# Patient Record
Sex: Male | Born: 1945 | Race: White | Hispanic: No | Smoking: Current every day smoker
Health system: Southern US, Community
[De-identification: ages and names within clinical notes are randomized; demographics above are authoritative.]

## PROBLEM LIST (undated history)

## (undated) DIAGNOSIS — N400 Enlarged prostate without lower urinary tract symptoms: Secondary | ICD-10-CM

## (undated) DIAGNOSIS — N189 Chronic kidney disease, unspecified: Secondary | ICD-10-CM

## (undated) DIAGNOSIS — R413 Other amnesia: Secondary | ICD-10-CM

## (undated) DIAGNOSIS — K5790 Diverticulosis of intestine, part unspecified, without perforation or abscess without bleeding: Secondary | ICD-10-CM

## (undated) DIAGNOSIS — K635 Polyp of colon: Secondary | ICD-10-CM

## (undated) DIAGNOSIS — I639 Cerebral infarction, unspecified: Secondary | ICD-10-CM

## (undated) DIAGNOSIS — I10 Essential (primary) hypertension: Secondary | ICD-10-CM

## (undated) DIAGNOSIS — N281 Cyst of kidney, acquired: Secondary | ICD-10-CM

## (undated) DIAGNOSIS — H9190 Unspecified hearing loss, unspecified ear: Secondary | ICD-10-CM

## (undated) DIAGNOSIS — D497 Neoplasm of unspecified behavior of endocrine glands and other parts of nervous system: Secondary | ICD-10-CM

## (undated) DIAGNOSIS — R42 Dizziness and giddiness: Secondary | ICD-10-CM

## (undated) DIAGNOSIS — R600 Localized edema: Secondary | ICD-10-CM

## (undated) DIAGNOSIS — D649 Anemia, unspecified: Secondary | ICD-10-CM

## (undated) DIAGNOSIS — F32A Depression, unspecified: Secondary | ICD-10-CM

## (undated) DIAGNOSIS — R609 Edema, unspecified: Secondary | ICD-10-CM

## (undated) DIAGNOSIS — G930 Cerebral cysts: Secondary | ICD-10-CM

## (undated) DIAGNOSIS — F329 Major depressive disorder, single episode, unspecified: Secondary | ICD-10-CM

## (undated) DIAGNOSIS — E7211 Homocystinuria: Secondary | ICD-10-CM

## (undated) DIAGNOSIS — R3129 Other microscopic hematuria: Secondary | ICD-10-CM

## (undated) DIAGNOSIS — N401 Enlarged prostate with lower urinary tract symptoms: Secondary | ICD-10-CM

## (undated) HISTORY — DX: Major depressive disorder, single episode, unspecified: F32.9

## (undated) HISTORY — DX: Localized edema: R60.0

## (undated) HISTORY — DX: Other microscopic hematuria: R31.29

## (undated) HISTORY — DX: Depression, unspecified: F32.A

## (undated) HISTORY — DX: Polyp of colon: K63.5

## (undated) HISTORY — DX: Other amnesia: R41.3

## (undated) HISTORY — DX: Unspecified hearing loss, unspecified ear: H91.90

## (undated) HISTORY — PX: LAPAROTOMY: SHX154

## (undated) HISTORY — DX: Dizziness and giddiness: R42

## (undated) HISTORY — DX: Benign prostatic hyperplasia without lower urinary tract symptoms: N40.0

## (undated) HISTORY — DX: Homocystinuria: E72.11

## (undated) HISTORY — DX: Anemia, unspecified: D64.9

## (undated) HISTORY — PX: BRAIN SURGERY: SHX531

## (undated) HISTORY — DX: Cerebral cysts: G93.0

## (undated) HISTORY — DX: Cerebral infarction, unspecified: I63.9

## (undated) HISTORY — DX: Neoplasm of unspecified behavior of endocrine glands and other parts of nervous system: D49.7

## (undated) HISTORY — DX: Benign prostatic hyperplasia with lower urinary tract symptoms: N40.1

## (undated) HISTORY — DX: Diverticulosis of intestine, part unspecified, without perforation or abscess without bleeding: K57.90

## (undated) HISTORY — DX: Essential (primary) hypertension: I10

## (undated) HISTORY — DX: Chronic kidney disease, unspecified: N18.9

## (undated) HISTORY — DX: Edema, unspecified: R60.9

## (undated) HISTORY — DX: Cyst of kidney, acquired: N28.1

---

## 1999-01-15 ENCOUNTER — Encounter: Admission: RE | Admit: 1999-01-15 | Discharge: 1999-01-15 | Payer: Self-pay | Admitting: Neurosurgery

## 1999-01-15 ENCOUNTER — Encounter: Payer: Self-pay | Admitting: Neurosurgery

## 2000-07-27 ENCOUNTER — Encounter: Payer: Self-pay | Admitting: Neurosurgery

## 2000-07-27 ENCOUNTER — Encounter: Admission: RE | Admit: 2000-07-27 | Discharge: 2000-07-27 | Payer: Self-pay | Admitting: Neurosurgery

## 2001-08-28 ENCOUNTER — Emergency Department (HOSPITAL_COMMUNITY): Admission: EM | Admit: 2001-08-28 | Discharge: 2001-08-28 | Payer: Self-pay | Admitting: *Deleted

## 2004-04-23 ENCOUNTER — Ambulatory Visit: Payer: Self-pay | Admitting: Internal Medicine

## 2004-11-17 ENCOUNTER — Ambulatory Visit: Payer: Self-pay | Admitting: Internal Medicine

## 2004-11-18 ENCOUNTER — Ambulatory Visit: Payer: Self-pay | Admitting: Internal Medicine

## 2004-11-21 ENCOUNTER — Ambulatory Visit: Payer: Self-pay | Admitting: Internal Medicine

## 2005-02-20 ENCOUNTER — Ambulatory Visit: Payer: Self-pay | Admitting: Internal Medicine

## 2005-04-27 ENCOUNTER — Ambulatory Visit: Payer: Self-pay | Admitting: Internal Medicine

## 2005-04-28 ENCOUNTER — Ambulatory Visit: Payer: Self-pay | Admitting: Internal Medicine

## 2005-05-06 ENCOUNTER — Ambulatory Visit: Payer: Self-pay | Admitting: Internal Medicine

## 2005-06-03 ENCOUNTER — Ambulatory Visit: Payer: Self-pay | Admitting: Internal Medicine

## 2005-06-04 ENCOUNTER — Ambulatory Visit: Payer: Self-pay | Admitting: Cardiology

## 2005-06-11 ENCOUNTER — Ambulatory Visit: Payer: Self-pay | Admitting: Internal Medicine

## 2005-07-03 ENCOUNTER — Ambulatory Visit: Payer: Self-pay

## 2005-11-13 ENCOUNTER — Ambulatory Visit: Payer: Self-pay | Admitting: Internal Medicine

## 2005-11-23 ENCOUNTER — Ambulatory Visit: Payer: Self-pay | Admitting: Cardiology

## 2005-11-23 ENCOUNTER — Ambulatory Visit: Payer: Self-pay | Admitting: Internal Medicine

## 2005-12-03 ENCOUNTER — Ambulatory Visit: Payer: Self-pay

## 2005-12-03 ENCOUNTER — Encounter: Admission: RE | Admit: 2005-12-03 | Discharge: 2005-12-03 | Payer: Self-pay | Admitting: Internal Medicine

## 2006-03-29 ENCOUNTER — Ambulatory Visit: Payer: Self-pay | Admitting: Internal Medicine

## 2006-03-30 DIAGNOSIS — H9190 Unspecified hearing loss, unspecified ear: Secondary | ICD-10-CM | POA: Insufficient documentation

## 2006-03-30 DIAGNOSIS — D369 Benign neoplasm, unspecified site: Secondary | ICD-10-CM | POA: Insufficient documentation

## 2006-04-16 ENCOUNTER — Ambulatory Visit: Payer: Self-pay | Admitting: Internal Medicine

## 2006-04-16 LAB — CONVERTED CEMR LAB
Chloride: 101 meq/L (ref 96–112)
Creatinine, Ser: 1.7 mg/dL — ABNORMAL HIGH (ref 0.4–1.5)
Glucose, Bld: 96 mg/dL (ref 70–99)
Potassium: 3.8 meq/L (ref 3.5–5.1)
Sodium: 142 meq/L (ref 135–145)

## 2006-04-30 ENCOUNTER — Encounter: Admission: RE | Admit: 2006-04-30 | Discharge: 2006-04-30 | Payer: Self-pay | Admitting: Internal Medicine

## 2006-10-07 ENCOUNTER — Telehealth (INDEPENDENT_AMBULATORY_CARE_PROVIDER_SITE_OTHER): Payer: Self-pay | Admitting: *Deleted

## 2006-10-27 ENCOUNTER — Inpatient Hospital Stay (HOSPITAL_COMMUNITY): Admission: EM | Admit: 2006-10-27 | Discharge: 2006-10-29 | Payer: Self-pay | Admitting: Emergency Medicine

## 2006-10-28 ENCOUNTER — Telehealth: Payer: Self-pay | Admitting: Internal Medicine

## 2006-10-28 ENCOUNTER — Encounter (INDEPENDENT_AMBULATORY_CARE_PROVIDER_SITE_OTHER): Payer: Self-pay | Admitting: Emergency Medicine

## 2006-10-29 ENCOUNTER — Telehealth (INDEPENDENT_AMBULATORY_CARE_PROVIDER_SITE_OTHER): Payer: Self-pay | Admitting: *Deleted

## 2006-10-29 ENCOUNTER — Encounter: Payer: Self-pay | Admitting: Internal Medicine

## 2006-11-01 ENCOUNTER — Emergency Department (HOSPITAL_COMMUNITY): Admission: EM | Admit: 2006-11-01 | Discharge: 2006-11-01 | Payer: Self-pay | Admitting: *Deleted

## 2006-11-02 ENCOUNTER — Ambulatory Visit: Payer: Self-pay | Admitting: Internal Medicine

## 2006-11-02 DIAGNOSIS — R42 Dizziness and giddiness: Secondary | ICD-10-CM

## 2006-11-02 DIAGNOSIS — I6350 Cerebral infarction due to unspecified occlusion or stenosis of unspecified cerebral artery: Secondary | ICD-10-CM | POA: Insufficient documentation

## 2006-11-02 DIAGNOSIS — I10 Essential (primary) hypertension: Secondary | ICD-10-CM | POA: Insufficient documentation

## 2006-11-02 DIAGNOSIS — F17200 Nicotine dependence, unspecified, uncomplicated: Secondary | ICD-10-CM | POA: Insufficient documentation

## 2006-11-05 LAB — CONVERTED CEMR LAB
CO2: 26 meq/L (ref 19–32)
Creatinine, Ser: 2.2 mg/dL — ABNORMAL HIGH (ref 0.4–1.5)
GFR calc Af Amer: 39 mL/min
Potassium: 4.2 meq/L (ref 3.5–5.1)
Sodium: 139 meq/L (ref 135–145)

## 2006-11-15 ENCOUNTER — Encounter: Payer: Self-pay | Admitting: Internal Medicine

## 2006-11-16 ENCOUNTER — Ambulatory Visit: Payer: Self-pay | Admitting: Internal Medicine

## 2006-11-16 DIAGNOSIS — D508 Other iron deficiency anemias: Secondary | ICD-10-CM

## 2006-11-26 ENCOUNTER — Ambulatory Visit: Payer: Self-pay | Admitting: Internal Medicine

## 2006-12-03 ENCOUNTER — Ambulatory Visit: Payer: Self-pay | Admitting: Internal Medicine

## 2006-12-13 ENCOUNTER — Ambulatory Visit: Payer: Self-pay | Admitting: Internal Medicine

## 2006-12-13 ENCOUNTER — Telehealth (INDEPENDENT_AMBULATORY_CARE_PROVIDER_SITE_OTHER): Payer: Self-pay | Admitting: *Deleted

## 2006-12-14 ENCOUNTER — Ambulatory Visit: Payer: Self-pay | Admitting: Internal Medicine

## 2006-12-16 ENCOUNTER — Encounter (INDEPENDENT_AMBULATORY_CARE_PROVIDER_SITE_OTHER): Payer: Self-pay | Admitting: *Deleted

## 2006-12-16 LAB — CONVERTED CEMR LAB
CO2: 29 meq/L (ref 19–32)
GFR calc Af Amer: 42 mL/min
Glucose, Bld: 77 mg/dL (ref 70–99)
Potassium: 4 meq/L (ref 3.5–5.1)
Sodium: 139 meq/L (ref 135–145)

## 2007-01-07 ENCOUNTER — Encounter: Payer: Self-pay | Admitting: Internal Medicine

## 2007-01-14 ENCOUNTER — Ambulatory Visit: Payer: Self-pay | Admitting: Internal Medicine

## 2007-01-31 ENCOUNTER — Encounter: Payer: Self-pay | Admitting: Internal Medicine

## 2007-02-11 ENCOUNTER — Encounter: Payer: Self-pay | Admitting: Internal Medicine

## 2007-02-16 ENCOUNTER — Encounter: Payer: Self-pay | Admitting: Internal Medicine

## 2007-02-21 ENCOUNTER — Telehealth (INDEPENDENT_AMBULATORY_CARE_PROVIDER_SITE_OTHER): Payer: Self-pay | Admitting: *Deleted

## 2007-02-25 ENCOUNTER — Telehealth (INDEPENDENT_AMBULATORY_CARE_PROVIDER_SITE_OTHER): Payer: Self-pay | Admitting: *Deleted

## 2007-02-28 ENCOUNTER — Encounter: Payer: Self-pay | Admitting: Internal Medicine

## 2007-03-07 ENCOUNTER — Telehealth (INDEPENDENT_AMBULATORY_CARE_PROVIDER_SITE_OTHER): Payer: Self-pay | Admitting: *Deleted

## 2007-03-21 ENCOUNTER — Ambulatory Visit: Payer: Self-pay | Admitting: Internal Medicine

## 2007-03-21 DIAGNOSIS — N259 Disorder resulting from impaired renal tubular function, unspecified: Secondary | ICD-10-CM | POA: Insufficient documentation

## 2007-03-22 ENCOUNTER — Telehealth (INDEPENDENT_AMBULATORY_CARE_PROVIDER_SITE_OTHER): Payer: Self-pay | Admitting: *Deleted

## 2007-03-24 ENCOUNTER — Telehealth (INDEPENDENT_AMBULATORY_CARE_PROVIDER_SITE_OTHER): Payer: Self-pay | Admitting: *Deleted

## 2007-03-24 LAB — CONVERTED CEMR LAB
Basophils Absolute: 0 10*3/uL (ref 0.0–0.1)
Calcium: 9.1 mg/dL (ref 8.4–10.5)
Folate: >20 ng/mL
GFR calc Af Amer: 24 mL/min
GFR calc non Af Amer: 20 mL/min
Glucose, Bld: 76 mg/dL (ref 70–99)
MCHC: 34.4 g/dL (ref 30.0–36.0)
Monocytes Absolute: 0.8 10*3/uL — ABNORMAL HIGH (ref 0.2–0.7)
Monocytes Relative: 7.6 % (ref 3.0–11.0)
Platelets: 278 10*3/uL (ref 150–400)
RBC: 4.84 M/uL (ref 4.22–5.81)
RDW: 13.7 % (ref 11.5–14.6)
Vitamin B-12: >1500 pg/mL — ABNORMAL HIGH (ref 211–911)

## 2007-04-06 ENCOUNTER — Telehealth: Payer: Self-pay | Admitting: Internal Medicine

## 2007-04-08 ENCOUNTER — Ambulatory Visit: Payer: Self-pay | Admitting: Internal Medicine

## 2007-04-13 ENCOUNTER — Telehealth: Payer: Self-pay | Admitting: Internal Medicine

## 2007-04-13 LAB — CONVERTED CEMR LAB
Calcium: 8.8 mg/dL (ref 8.4–10.5)
GFR calc Af Amer: 44 mL/min
GFR calc non Af Amer: 36 mL/min
Glucose, Bld: 127 mg/dL — ABNORMAL HIGH (ref 70–99)
Sodium: 141 meq/L (ref 135–145)

## 2007-04-22 ENCOUNTER — Ambulatory Visit: Payer: Self-pay | Admitting: Internal Medicine

## 2007-05-09 ENCOUNTER — Encounter: Payer: Self-pay | Admitting: Internal Medicine

## 2007-05-20 ENCOUNTER — Encounter: Payer: Self-pay | Admitting: Internal Medicine

## 2007-05-24 ENCOUNTER — Ambulatory Visit: Payer: Self-pay | Admitting: Internal Medicine

## 2007-06-24 ENCOUNTER — Ambulatory Visit: Payer: Self-pay | Admitting: Internal Medicine

## 2007-07-25 ENCOUNTER — Ambulatory Visit: Payer: Self-pay | Admitting: Internal Medicine

## 2007-08-09 ENCOUNTER — Encounter: Payer: Self-pay | Admitting: Internal Medicine

## 2007-08-26 ENCOUNTER — Ambulatory Visit: Payer: Self-pay | Admitting: Internal Medicine

## 2007-09-01 ENCOUNTER — Emergency Department (HOSPITAL_COMMUNITY): Admission: EM | Admit: 2007-09-01 | Discharge: 2007-09-01 | Payer: Self-pay | Admitting: Family Medicine

## 2007-09-01 ENCOUNTER — Telehealth: Payer: Self-pay | Admitting: Internal Medicine

## 2007-09-05 ENCOUNTER — Telehealth (INDEPENDENT_AMBULATORY_CARE_PROVIDER_SITE_OTHER): Payer: Self-pay | Admitting: *Deleted

## 2007-09-23 ENCOUNTER — Ambulatory Visit: Payer: Self-pay | Admitting: Internal Medicine

## 2007-09-30 ENCOUNTER — Ambulatory Visit: Payer: Self-pay | Admitting: Internal Medicine

## 2007-09-30 DIAGNOSIS — R413 Other amnesia: Secondary | ICD-10-CM

## 2007-09-30 DIAGNOSIS — E7210 Disorders of sulfur-bearing amino-acid metabolism, unspecified: Secondary | ICD-10-CM | POA: Insufficient documentation

## 2007-10-21 ENCOUNTER — Ambulatory Visit: Payer: Self-pay | Admitting: Internal Medicine

## 2007-11-18 ENCOUNTER — Ambulatory Visit: Payer: Self-pay | Admitting: Internal Medicine

## 2007-12-16 ENCOUNTER — Encounter (INDEPENDENT_AMBULATORY_CARE_PROVIDER_SITE_OTHER): Payer: Self-pay | Admitting: *Deleted

## 2007-12-16 ENCOUNTER — Ambulatory Visit: Payer: Self-pay | Admitting: Internal Medicine

## 2007-12-19 ENCOUNTER — Encounter (INDEPENDENT_AMBULATORY_CARE_PROVIDER_SITE_OTHER): Payer: Self-pay | Admitting: *Deleted

## 2007-12-30 ENCOUNTER — Encounter: Payer: Self-pay | Admitting: Internal Medicine

## 2007-12-30 ENCOUNTER — Ambulatory Visit: Payer: Self-pay | Admitting: Gastroenterology

## 2007-12-30 ENCOUNTER — Ambulatory Visit: Payer: Self-pay | Admitting: Internal Medicine

## 2007-12-31 ENCOUNTER — Encounter: Payer: Self-pay | Admitting: Internal Medicine

## 2008-01-02 ENCOUNTER — Encounter: Payer: Self-pay | Admitting: Internal Medicine

## 2008-01-02 LAB — CONVERTED CEMR LAB: Homocysteine: 11.6 micromoles/L (ref 4.0–15.4)

## 2008-01-04 ENCOUNTER — Telehealth (INDEPENDENT_AMBULATORY_CARE_PROVIDER_SITE_OTHER): Payer: Self-pay | Admitting: *Deleted

## 2008-01-04 LAB — CONVERTED CEMR LAB
Calcium: 9.1 mg/dL (ref 8.4–10.5)
Cholesterol: 110 mg/dL (ref 0–200)
Folate: >20 ng/mL
GFR calc Af Amer: 44 mL/min
GFR calc non Af Amer: 36 mL/min
Hemoglobin: 14.9 g/dL (ref 13.0–17.0)
Iron: 120 ug/dL (ref 42–165)
LDL Cholesterol: 38 mg/dL (ref 0–99)
Sodium: 143 meq/L (ref 135–145)
Total CHOL/HDL Ratio: 1.8

## 2008-01-06 ENCOUNTER — Ambulatory Visit: Payer: Self-pay | Admitting: Internal Medicine

## 2008-01-31 ENCOUNTER — Telehealth (INDEPENDENT_AMBULATORY_CARE_PROVIDER_SITE_OTHER): Payer: Self-pay | Admitting: *Deleted

## 2008-02-03 ENCOUNTER — Ambulatory Visit: Payer: Self-pay | Admitting: Internal Medicine

## 2008-02-03 ENCOUNTER — Encounter (INDEPENDENT_AMBULATORY_CARE_PROVIDER_SITE_OTHER): Payer: Self-pay | Admitting: *Deleted

## 2008-02-17 ENCOUNTER — Encounter: Admission: RE | Admit: 2008-02-17 | Discharge: 2008-02-17 | Payer: Self-pay | Admitting: Internal Medicine

## 2008-02-20 ENCOUNTER — Telehealth (INDEPENDENT_AMBULATORY_CARE_PROVIDER_SITE_OTHER): Payer: Self-pay | Admitting: *Deleted

## 2008-02-21 ENCOUNTER — Ambulatory Visit: Payer: Self-pay | Admitting: Internal Medicine

## 2008-02-22 ENCOUNTER — Encounter (INDEPENDENT_AMBULATORY_CARE_PROVIDER_SITE_OTHER): Payer: Self-pay | Admitting: *Deleted

## 2008-02-24 ENCOUNTER — Ambulatory Visit: Payer: Self-pay | Admitting: Internal Medicine

## 2008-03-07 ENCOUNTER — Ambulatory Visit: Payer: Self-pay | Admitting: Internal Medicine

## 2008-03-07 LAB — CONVERTED CEMR LAB: Glucose, Bld: 133 mg/dL

## 2008-03-08 ENCOUNTER — Telehealth (INDEPENDENT_AMBULATORY_CARE_PROVIDER_SITE_OTHER): Payer: Self-pay | Admitting: *Deleted

## 2008-03-14 ENCOUNTER — Encounter: Payer: Self-pay | Admitting: Internal Medicine

## 2008-03-30 ENCOUNTER — Ambulatory Visit: Payer: Self-pay | Admitting: Internal Medicine

## 2008-04-27 ENCOUNTER — Encounter (INDEPENDENT_AMBULATORY_CARE_PROVIDER_SITE_OTHER): Payer: Self-pay | Admitting: *Deleted

## 2008-05-08 ENCOUNTER — Ambulatory Visit: Payer: Self-pay | Admitting: Internal Medicine

## 2008-05-11 ENCOUNTER — Encounter: Payer: Self-pay | Admitting: Internal Medicine

## 2008-05-17 ENCOUNTER — Telehealth: Payer: Self-pay | Admitting: Internal Medicine

## 2008-05-23 ENCOUNTER — Telehealth: Payer: Self-pay | Admitting: Internal Medicine

## 2008-05-28 ENCOUNTER — Ambulatory Visit (HOSPITAL_COMMUNITY): Admission: RE | Admit: 2008-05-28 | Discharge: 2008-05-28 | Payer: Self-pay | Admitting: Neurosurgery

## 2008-05-30 ENCOUNTER — Encounter: Payer: Self-pay | Admitting: Internal Medicine

## 2008-06-15 ENCOUNTER — Ambulatory Visit: Payer: Self-pay | Admitting: Internal Medicine

## 2008-06-18 ENCOUNTER — Telehealth (INDEPENDENT_AMBULATORY_CARE_PROVIDER_SITE_OTHER): Payer: Self-pay | Admitting: *Deleted

## 2008-06-18 LAB — CONVERTED CEMR LAB
Eosinophils Relative: 1.4 % (ref 0.0–5.0)
GFR calc non Af Amer: 43.59 mL/min (ref >60)
HCT: 42.6 % (ref 39.0–52.0)
Hemoglobin: 14.8 g/dL (ref 13.0–17.0)
Lymphocytes Relative: 17.5 % (ref 12.0–46.0)
Lymphs Abs: 1.3 10*3/uL (ref 0.7–4.0)
Monocytes Relative: 5.6 % (ref 3.0–12.0)
Platelets: 207 10*3/uL (ref 150.0–400.0)
Potassium: 4.1 meq/L (ref 3.5–5.1)
Sodium: 143 meq/L (ref 135–145)
WBC: 7.5 10*3/uL (ref 4.5–10.5)

## 2008-06-25 ENCOUNTER — Encounter: Payer: Self-pay | Admitting: Internal Medicine

## 2008-06-27 ENCOUNTER — Encounter: Payer: Self-pay | Admitting: Internal Medicine

## 2008-06-28 ENCOUNTER — Inpatient Hospital Stay (HOSPITAL_COMMUNITY): Admission: RE | Admit: 2008-06-28 | Discharge: 2008-06-30 | Payer: Self-pay | Admitting: Neurosurgery

## 2008-06-29 ENCOUNTER — Telehealth: Payer: Self-pay | Admitting: Internal Medicine

## 2008-07-09 ENCOUNTER — Encounter: Payer: Self-pay | Admitting: Internal Medicine

## 2008-07-25 ENCOUNTER — Telehealth: Payer: Self-pay | Admitting: Internal Medicine

## 2008-07-25 ENCOUNTER — Ambulatory Visit: Payer: Self-pay | Admitting: Internal Medicine

## 2008-08-24 ENCOUNTER — Ambulatory Visit: Payer: Self-pay | Admitting: Internal Medicine

## 2008-09-21 ENCOUNTER — Ambulatory Visit: Payer: Self-pay | Admitting: Internal Medicine

## 2008-09-26 ENCOUNTER — Telehealth (INDEPENDENT_AMBULATORY_CARE_PROVIDER_SITE_OTHER): Payer: Self-pay | Admitting: *Deleted

## 2008-10-01 ENCOUNTER — Telehealth (INDEPENDENT_AMBULATORY_CARE_PROVIDER_SITE_OTHER): Payer: Self-pay | Admitting: *Deleted

## 2008-10-29 ENCOUNTER — Ambulatory Visit: Payer: Self-pay | Admitting: Internal Medicine

## 2008-11-02 ENCOUNTER — Telehealth: Payer: Self-pay | Admitting: Internal Medicine

## 2008-11-06 ENCOUNTER — Telehealth: Payer: Self-pay | Admitting: Internal Medicine

## 2008-11-09 ENCOUNTER — Ambulatory Visit: Payer: Self-pay | Admitting: Internal Medicine

## 2008-11-15 ENCOUNTER — Ambulatory Visit: Payer: Self-pay | Admitting: Internal Medicine

## 2008-11-16 LAB — CONVERTED CEMR LAB
CO2: 28 meq/L (ref 19–32)
Calcium: 8.8 mg/dL (ref 8.4–10.5)
Chloride: 102 meq/L (ref 96–112)
Glucose, Bld: 74 mg/dL (ref 70–99)
Sodium: 140 meq/L (ref 135–145)

## 2008-12-10 ENCOUNTER — Telehealth (INDEPENDENT_AMBULATORY_CARE_PROVIDER_SITE_OTHER): Payer: Self-pay | Admitting: *Deleted

## 2008-12-14 ENCOUNTER — Ambulatory Visit: Payer: Self-pay | Admitting: Internal Medicine

## 2008-12-26 ENCOUNTER — Ambulatory Visit: Payer: Self-pay | Admitting: Internal Medicine

## 2008-12-27 ENCOUNTER — Telehealth (INDEPENDENT_AMBULATORY_CARE_PROVIDER_SITE_OTHER): Payer: Self-pay | Admitting: *Deleted

## 2008-12-31 ENCOUNTER — Telehealth: Payer: Self-pay | Admitting: Internal Medicine

## 2008-12-31 LAB — CONVERTED CEMR LAB
ALT: 13 units/L (ref 0–53)
AST: 19 units/L (ref 0–37)
Albumin: 4 g/dL (ref 3.5–5.2)
Alkaline Phosphatase: 66 units/L (ref 39–117)
BUN: 21 mg/dL (ref 6–23)
Basophils Relative: 1.2 % (ref 0.0–3.0)
Calcium: 9.1 mg/dL (ref 8.4–10.5)
Chloride: 105 meq/L (ref 96–112)
Creatinine, Ser: 2.2 mg/dL — ABNORMAL HIGH (ref 0.4–1.5)
Eosinophils Relative: 3 % (ref 0.0–5.0)
GFR calc non Af Amer: 32.32 mL/min (ref >60)
Lymphocytes Relative: 33.7 % (ref 12.0–46.0)
Monocytes Relative: 5.7 % (ref 3.0–12.0)
Neutrophils Relative %: 56.4 % (ref 43.0–77.0)
Platelets: 224 10*3/uL (ref 150.0–400.0)
RBC: 4.74 M/uL (ref 4.22–5.81)
TSH: 0.88 microintl units/mL (ref 0.35–5.50)
Total Protein: 6.5 g/dL (ref 6.0–8.3)
WBC: 6.2 10*3/uL (ref 4.5–10.5)

## 2009-01-01 ENCOUNTER — Ambulatory Visit: Payer: Self-pay | Admitting: Internal Medicine

## 2009-01-01 ENCOUNTER — Ambulatory Visit: Payer: Self-pay | Admitting: Cardiology

## 2009-01-03 ENCOUNTER — Encounter: Payer: Self-pay | Admitting: Internal Medicine

## 2009-01-07 ENCOUNTER — Emergency Department (HOSPITAL_COMMUNITY): Admission: EM | Admit: 2009-01-07 | Discharge: 2009-01-07 | Payer: Self-pay | Admitting: Emergency Medicine

## 2009-01-07 ENCOUNTER — Telehealth (INDEPENDENT_AMBULATORY_CARE_PROVIDER_SITE_OTHER): Payer: Self-pay | Admitting: *Deleted

## 2009-01-10 ENCOUNTER — Telehealth: Payer: Self-pay | Admitting: Internal Medicine

## 2009-01-14 ENCOUNTER — Ambulatory Visit: Payer: Self-pay | Admitting: Internal Medicine

## 2009-01-18 ENCOUNTER — Ambulatory Visit (HOSPITAL_COMMUNITY): Admission: RE | Admit: 2009-01-18 | Discharge: 2009-01-18 | Payer: Self-pay | Admitting: Neurosurgery

## 2009-01-18 ENCOUNTER — Ambulatory Visit: Payer: Self-pay | Admitting: Internal Medicine

## 2009-01-21 ENCOUNTER — Emergency Department (HOSPITAL_COMMUNITY): Admission: EM | Admit: 2009-01-21 | Discharge: 2009-01-21 | Payer: Self-pay | Admitting: Emergency Medicine

## 2009-01-22 ENCOUNTER — Telehealth: Payer: Self-pay | Admitting: Internal Medicine

## 2009-01-23 ENCOUNTER — Encounter: Payer: Self-pay | Admitting: Internal Medicine

## 2009-02-08 ENCOUNTER — Ambulatory Visit: Payer: Self-pay | Admitting: Internal Medicine

## 2009-03-18 ENCOUNTER — Ambulatory Visit: Payer: Self-pay | Admitting: Internal Medicine

## 2009-03-22 ENCOUNTER — Telehealth (INDEPENDENT_AMBULATORY_CARE_PROVIDER_SITE_OTHER): Payer: Self-pay | Admitting: *Deleted

## 2009-03-25 ENCOUNTER — Emergency Department (HOSPITAL_COMMUNITY): Admission: EM | Admit: 2009-03-25 | Discharge: 2009-03-25 | Payer: Self-pay | Admitting: Emergency Medicine

## 2009-03-26 ENCOUNTER — Telehealth: Payer: Self-pay | Admitting: Internal Medicine

## 2009-04-05 ENCOUNTER — Telehealth: Payer: Self-pay | Admitting: Internal Medicine

## 2009-04-29 ENCOUNTER — Telehealth: Payer: Self-pay | Admitting: Internal Medicine

## 2009-04-30 ENCOUNTER — Ambulatory Visit: Payer: Self-pay | Admitting: Internal Medicine

## 2009-05-24 ENCOUNTER — Telehealth: Payer: Self-pay | Admitting: Gastroenterology

## 2009-05-24 ENCOUNTER — Ambulatory Visit: Payer: Self-pay | Admitting: Internal Medicine

## 2009-05-24 ENCOUNTER — Ambulatory Visit: Payer: Self-pay | Admitting: Family Medicine

## 2009-05-24 LAB — CONVERTED CEMR LAB
Basophils Relative: 0.4 % (ref 0.0–3.0)
Eosinophils Relative: 1.9 % (ref 0.0–5.0)
HCT: 45.2 % (ref 39.0–52.0)
Lymphs Abs: 1.9 10*3/uL (ref 0.7–4.0)
MCV: 94.2 fL (ref 78.0–100.0)
Monocytes Absolute: 0.5 10*3/uL (ref 0.1–1.0)
Monocytes Relative: 8.1 % (ref 3.0–12.0)
Platelets: 209 10*3/uL (ref 150.0–400.0)
RBC: 4.8 M/uL (ref 4.22–5.81)
WBC: 6.3 10*3/uL (ref 4.5–10.5)

## 2009-06-21 ENCOUNTER — Ambulatory Visit: Payer: Self-pay | Admitting: Internal Medicine

## 2009-07-19 ENCOUNTER — Ambulatory Visit: Payer: Self-pay | Admitting: Internal Medicine

## 2009-08-09 ENCOUNTER — Ambulatory Visit: Payer: Self-pay | Admitting: Internal Medicine

## 2009-08-12 ENCOUNTER — Telehealth: Payer: Self-pay | Admitting: Internal Medicine

## 2009-08-16 ENCOUNTER — Ambulatory Visit: Payer: Self-pay | Admitting: Internal Medicine

## 2009-08-21 ENCOUNTER — Encounter: Payer: Self-pay | Admitting: Internal Medicine

## 2009-08-21 ENCOUNTER — Telehealth: Payer: Self-pay | Admitting: Internal Medicine

## 2009-08-30 ENCOUNTER — Ambulatory Visit: Payer: Self-pay | Admitting: Internal Medicine

## 2009-08-30 DIAGNOSIS — N281 Cyst of kidney, acquired: Secondary | ICD-10-CM | POA: Insufficient documentation

## 2009-08-30 LAB — CONVERTED CEMR LAB
Hemoglobin: 14.3 g/dL
Vitamin B-12: 1370 pg/mL — ABNORMAL HIGH (ref 211–911)

## 2009-09-02 ENCOUNTER — Telehealth (INDEPENDENT_AMBULATORY_CARE_PROVIDER_SITE_OTHER): Payer: Self-pay | Admitting: *Deleted

## 2009-09-02 ENCOUNTER — Telehealth: Payer: Self-pay | Admitting: Gastroenterology

## 2009-09-03 ENCOUNTER — Telehealth (INDEPENDENT_AMBULATORY_CARE_PROVIDER_SITE_OTHER): Payer: Self-pay | Admitting: *Deleted

## 2009-09-03 ENCOUNTER — Encounter (INDEPENDENT_AMBULATORY_CARE_PROVIDER_SITE_OTHER): Payer: Self-pay | Admitting: *Deleted

## 2009-09-03 ENCOUNTER — Ambulatory Visit: Payer: Self-pay | Admitting: Gastroenterology

## 2009-09-03 LAB — CONVERTED CEMR LAB: Vit D, 25-Hydroxy: 22 ng/mL — ABNORMAL LOW (ref 30–89)

## 2009-09-04 ENCOUNTER — Encounter: Payer: Self-pay | Admitting: Internal Medicine

## 2009-09-04 LAB — CONVERTED CEMR LAB
Alkaline Phosphatase: 72 units/L
Calcium: 8.7 mg/dL
Creatinine, Ser: 1.9 mg/dL
Hemoglobin: 14.6 g/dL
Platelets: 192 10*3/uL
WBC, blood: 4.6 10*3/uL

## 2009-09-05 ENCOUNTER — Ambulatory Visit: Payer: Self-pay | Admitting: Gastroenterology

## 2009-09-05 ENCOUNTER — Ambulatory Visit (HOSPITAL_COMMUNITY): Admission: RE | Admit: 2009-09-05 | Discharge: 2009-09-05 | Payer: Self-pay | Admitting: Gastroenterology

## 2009-09-11 ENCOUNTER — Encounter: Payer: Self-pay | Admitting: Gastroenterology

## 2009-09-13 ENCOUNTER — Ambulatory Visit: Payer: Self-pay | Admitting: Internal Medicine

## 2009-10-04 ENCOUNTER — Telehealth (INDEPENDENT_AMBULATORY_CARE_PROVIDER_SITE_OTHER): Payer: Self-pay | Admitting: *Deleted

## 2009-10-18 ENCOUNTER — Ambulatory Visit: Payer: Self-pay | Admitting: Internal Medicine

## 2009-11-15 ENCOUNTER — Ambulatory Visit: Payer: Self-pay | Admitting: Internal Medicine

## 2009-11-25 ENCOUNTER — Telehealth: Payer: Self-pay | Admitting: Internal Medicine

## 2009-12-13 ENCOUNTER — Ambulatory Visit: Payer: Self-pay | Admitting: Internal Medicine

## 2009-12-22 ENCOUNTER — Encounter: Payer: Self-pay | Admitting: Internal Medicine

## 2009-12-27 ENCOUNTER — Telehealth: Payer: Self-pay | Admitting: Internal Medicine

## 2010-01-10 ENCOUNTER — Ambulatory Visit: Payer: Self-pay | Admitting: Internal Medicine

## 2010-01-13 ENCOUNTER — Telehealth: Payer: Self-pay | Admitting: Internal Medicine

## 2010-02-10 ENCOUNTER — Ambulatory Visit: Payer: Self-pay | Admitting: Internal Medicine

## 2010-03-17 ENCOUNTER — Ambulatory Visit
Admission: RE | Admit: 2010-03-17 | Discharge: 2010-03-17 | Payer: Self-pay | Source: Home / Self Care | Attending: Internal Medicine | Admitting: Internal Medicine

## 2010-03-24 ENCOUNTER — Telehealth: Payer: Self-pay | Admitting: Internal Medicine

## 2010-03-25 ENCOUNTER — Ambulatory Visit
Admission: RE | Admit: 2010-03-25 | Discharge: 2010-03-25 | Payer: Self-pay | Source: Home / Self Care | Attending: Internal Medicine | Admitting: Internal Medicine

## 2010-03-25 DIAGNOSIS — H9319 Tinnitus, unspecified ear: Secondary | ICD-10-CM | POA: Insufficient documentation

## 2010-03-29 ENCOUNTER — Encounter: Payer: Self-pay | Admitting: Internal Medicine

## 2010-03-30 ENCOUNTER — Encounter: Payer: Self-pay | Admitting: Neurosurgery

## 2010-03-30 ENCOUNTER — Encounter: Payer: Self-pay | Admitting: Internal Medicine

## 2010-04-10 NOTE — Progress Notes (Signed)
Summary: FYI--radiology  Phone Note From Other Clinic   Caller: Steward Drone [place]--[phone] Summary of Call: Steward Drone called to notify that patient was not seen for radiology appt today due to patient cx. He made them aware that he was having car trouble and could not come today, but she just wanted MD to know due to the fact that patient did not r/s appt. Initial call taken by: Lucious Groves CMA,  January 13, 2010 3:52 PM  Follow-up for Phone Call        please called the patient and reschedule the ultrasound if so desired Follow-up by: [facility] E. Paz MD,  January 14, 2010 4:53 PM  Additional Follow-up for Phone Call Additional follow up Details #1::        I spoke with the patient and gave him the # to call and r/s the study, he states that he will r/s for Friday. Additional Follow-up by: Lucious Groves CMA,  January 15, 2010 9:38 AM

## 2010-04-10 NOTE — Assessment & Plan Note (Signed)
Summary: b12/swh  Nurse Visit   Vital Signs:  Patient profile:   65 year old male BP sitting:   124 / 76  (left arm) Cuff size:   regular  Vitals Entered By: Alfred Levins, CMA (April 30, 2009 12:08 PM)  Allergies: 1)  ! * Chantix  Immunizations Administered:  Pneumonia Vaccine:    Vaccine Type: Pneumovax    Site: left deltoid    Mfr: Merck    Dose: 0.5 ml    Route: IM    Given by: Alfred Levins, CMA    Exp. 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

## 2010-04-10 NOTE — Progress Notes (Signed)
Summary: fyi  Phone Note Call from Patient   Caller: Daughter Summary of Call: Patient was seen in ED yesterday.  Did not have CVA.  Cyst is bigger since surgery but not as big as before surgery.  Patient was advised to f/u with neuro.  Daughter will call for ov with neuro & will contact me if needs referral Shary Decamp  March 26, 2009 10:10 AM

## 2010-04-10 NOTE — Assessment & Plan Note (Signed)
Summary: b-12 inj/cbs  Nurse Visit   Allergies: 1)  ! 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

## 2010-04-10 NOTE — Progress Notes (Signed)
Summary: kidney cyst   Phone Note Outgoing Call   Summary of Call: 4 months ago, he had a CT of the abdomen at [facility]  , results show possibly 2 cysts in the ** R**  kidney; I send the imaging is to our radiologist but they are not allowed to compare outside films. On reviewing his medical records, he had ultrasound 11-2005 that showed  a 2.4cm right renal cyst. plan: Please return the CT to the patient's daughter  Schedule a renal uncle sound at [facility], [place] right kidney cyst Jose E. Paz MD  December 27, 2009 1:39 PM   Follow-up for Phone Call        left message for pts daughter to call back. Army Fossa CMA  December 27, 2009 2:36 PM   Additional Follow-up for Phone Call Additional follow up Details #1::        Patient daughter notified. Lucious Groves CMA  December 27, 2009 2:42 PM  Additional Follow-up by: Lucious Groves CMA,  December 27, 2009 2:42 PM

## 2010-04-10 NOTE — Assessment & Plan Note (Signed)
Summary: B12 INJ/cdw  Nurse Visit   Allergies: 1)  ! 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

## 2010-04-10 NOTE — Progress Notes (Signed)
Summary: Appt sooner  Phone Note From Other Clinic   Caller:   Call For:   Reason for Call: Talk to Nurse Initial call taken by:   Caller: Marisue Ivan [phone] Call For: Dr Christella Hartigan Reason for Call: Schedule Patient Appt Summary of Call: Upcoming appoinmnet in July would like pt seen sooner  Initial call taken by: Leanor Kail [facility],  September 02, 2009 1:17 PM  Follow-up for Phone Call        Appt. changed to tomorrow morning.Marisue Ivan will contact pt. Follow-up by: Teryl Lucy RN,  September 02, 2009 1:47 PM

## 2010-04-10 NOTE — Progress Notes (Signed)
Summary: ASKING QUESTIONS ABOUT CD OF ABDOMEN AND PELVIS  Phone Note Call from Patient Call back at [facility] = [phone]   Caller: DAUGHTER CINDY Summary of Call: PATIENT'S DAUGHTER CINDY (STATES SHE IS HIS HEALTHCARE POA)  CALLED TO ASK A QUESTION:  DR PAZ HAD REQUESTED A CD OF  "CT OF ABDOMEN AND PELVIS FROM [facility]"---SEE PHONE NOTE ABOUT IMAGING REPORT DATED 6/28  DAUGHTER CINDY WORKS AT [employer], SO SHE SAID THAT SHE SENT A CD OF THE ABDOMEN AND PELVIS THROUGH INTER-OFFICE MAIL TO DR PAZ ATTENTION SOON AFTER 6/28---HASNT HEARD ANYTHING YET---  DID HE GET IT?  PLEASE CALL HER AT [phone]   Initial call taken by: Jerolyn Shin,  October 04, 2009 4:00 PM  Follow-up for Phone Call        please call the patient's daughter We will send the x-rays to a radiologist for review. There will likely be a charge for that service.  Additional Follow-up for Phone Call Additional follow up Details #1::        I spoke to pts daughter and she states she will ask her dad about sending them because his finacies are tight right now. She would like to know what will you do if you do not send the x-rays to a radiologist. Army Fossa CMA  October 08, 2009 12:47 PM  plan: send XR to our radiology department for comparason they saw a kidney cyst on recent CT and we had old film in file Angleton E. Paz MD  October 09, 2009 8:37 AM     Additional Follow-up for Phone Call Additional follow up Details #2::    CD was sent to [facility] through the currier service.   LM to inform daughter. Follow-up by: Harold Barban,  October 09, 2009 1:55 PM

## 2010-04-10 NOTE — Letter (Signed)
Summary: [facility] Instructions  [facility]  [address] [place], [place] [address]   Phone: [phone]  Fax: [phone]       Zachary Fields    10-25-1945    MRN: [id number]        Procedure Day /Date:09/05/09  Zachary Fields     Arrival Time:915 am     Procedure Time:1015 am     Location of Procedure:                     X  [facility] ( Outpatient Registration)                        PREPARATION FOR COLONOSCOPY WITH MOVIPREP   Starting 5 days prior to your procedure TODAY do not eat nuts, seeds, popcorn, corn, beans, peas,  salads, or any raw vegetables.  Do not take any fiber supplements (e.g. Metamucil, Citrucel, and Benefiber).  THE DAY BEFORE YOUR PROCEDURE         DATE:09/04/09   DAY: WED  1.  Drink clear liquids the entire day-NO SOLID FOOD  2.  Do not drink anything colored red or purple.  Avoid juices with pulp.  No orange juice.  3.  Drink at least 64 oz. (8 glasses) of fluid/clear liquids during the day to prevent dehydration and help the prep work efficiently.  CLEAR LIQUIDS INCLUDE: Water Jello Ice Popsicles Tea (sugar ok, no milk/cream) Powdered fruit flavored drinks Coffee (sugar ok, no milk/cream) Gatorade Juice: apple, white grape, white cranberry  Lemonade Clear bullion, consomm, broth Carbonated beverages (any kind) Strained chicken noodle soup Hard Candy                             4.  In the morning, mix first dose of MoviPrep solution:    Empty 1 Pouch A and 1 Pouch B into the disposable container    Add lukewarm drinking water to the top line of the container. Mix to dissolve    Refrigerate (mixed solution should be used within 24 hrs)  5.  Begin drinking the prep at 5:00 p.m. The MoviPrep container is divided by 4 marks.   Every 15 minutes drink the solution down to the next mark (approximately 8 oz) until the full liter is complete.   6.  Follow completed prep with 16 oz of clear liquid of your choice (Nothing  red or purple).  Continue to drink clear liquids until bedtime.  7.  Before going to bed, mix second dose of MoviPrep solution:    Empty 1 Pouch A and 1 Pouch B into the disposable container    Add lukewarm drinking water to the top line of the container. Mix to dissolve    Refrigerate  THE DAY OF YOUR PROCEDURE      DATE: 09/05/09 DAY: THURS  Beginning at 515 a.m. (5 hours before procedure):         1. Every 15 minutes, drink the solution down to the next mark (approx 8 oz) until the full liter is complete.  2. Follow completed prep with 16 oz. of clear liquid of your choice.    3. You may drink clear liquids until 615 am (4 HOURS BEFORE PROCEDURE).   MEDICATION INSTRUCTIONS  Unless otherwise instructed, you should take regular prescription medications with a small sip of water   as early as possible the morning of your  procedure.           OTHER INSTRUCTIONS  You will need a responsible adult at least 64 years of age to accompany you and drive you home.   This person must remain in the waiting room during your procedure.  Wear loose fitting clothing that is easily removed.  Leave jewelry and other valuables at home.  However, you may wish to bring a book to read or  an iPod/MP3 player to listen to music as you wait for your procedure to start.  Remove all body piercing jewelry and leave at home.  Total time from sign-in until discharge is approximately 2-3 hours.  You should go home directly after your procedure and rest.  You can resume normal activities the  day after your procedure.  The day of your procedure you should not:   Drive   Make legal decisions   Operate machinery   Drink alcohol   Return to work  You will receive specific instructions about eating, activities and medications before you leave.    The above instructions have been reviewed and explained to me by   _______________________    I fully understand and can verbalize these  instructions _____________________________ Date _________

## 2010-04-10 NOTE — Progress Notes (Signed)
Summary: pneumonia vaccine  Phone Note Call from Patient Call back at [phone]   Caller: Daughter Call For: paz Summary of Call: can pt get pneumonia vaccine when he comes in for his b12 injection in two weeks?  He had pneumonia in november Initial call taken by: Alfred Levins, CMA,  April 05, 2009 3:42 PM  Follow-up for Phone Call        yes Jose E. Paz MD  April 07, 2009 12:37 PM   Additional Follow-up for Phone Call Additional follow up Details #1::        pt made aware Additional Follow-up by: Alfred Levins, CMA,  April 08, 2009 8:42 AM

## 2010-04-10 NOTE — Miscellaneous (Signed)
Summary: Labs   Clinical Lists Changes  Observations: Added new observation of ALK PHOS: 72 units/L (09/04/2009 16:48) Added new observation of SGPT (ALT): 15 units/L (09/04/2009 16:48) Added new observation of SGOT (AST): 15 units/L (09/04/2009 16:48) Added new observation of CALCIUM: 8.7 mg/dL (04/54/0981 19:14) Added new observation of CREATININE: 1.9 mg/dL (78/29/5621 30:86) Added new observation of BUN: 22 mg/dL (57/84/6962 95:28) Added new observation of POTASSIUM: 3.9 mmol/L (09/04/2009 16:48) Added new observation of PLATELETS: 192 10*3/mm3 (09/04/2009 16:48) Added new observation of HGB: 14.6 g/dL (41/32/4401 02:72) Added new observation of WBC: 4.6 10*3/mm3 (09/04/2009 16:48)  BN- Peptide 93.0 pg/mL

## 2010-04-10 NOTE — Progress Notes (Signed)
Summary: numbness  Phone Note Call from Patient   Caller: Daughter Summary of Call: Patient reports numbness all over.  No Slurred speech, not disoriented.  No other sxs.   Initial call taken by: Shary Decamp,  March 22, 2009 3:09 PM  Follow-up for Phone Call        if any stroke symptoms (slurred speach,focal deficits, severe dizzines) , CP , severe HA--ER if symptoms severe-- ER I think he had similar symptoms before and went to the ER before, please confirm that [facility] E. Paz MD  March 22, 2009 3:21 PM  He has not had these sxs since his last surgery Shary Decamp  March 22, 2009 3:34 PM ER eval, Ican not asses this over the phone Marvell E. Paz MD  March 22, 2009 3:42 PM

## 2010-04-10 NOTE — Procedures (Signed)
Summary: Colonoscopy  Patient: Zachary Fields Note: All result statuses are Final unless otherwise noted.  Tests: (1) Colonoscopy (COL)   COL Colonoscopy           DONE     [facility]     [address] [place], [place]  [address]           COLONOSCOPY PROCEDURE REPORT           PATIENT:  Zachary Fields, Zachary Fields  MR#:  [id number]     BIRTHDATE:  1945-11-06, 64 yrs. old  GENDER:  male     ENDOSCOPIST:  Rachael Fee, MD     REF. BY:  Willow Ora, M.D.     PROCEDURE DATE:  09/05/2009     PROCEDURE:  Colonoscopy with snare polypectomy     ASA CLASS:  Class II     INDICATIONS:  minor rectal bleeding (not anemic), family history     of colon cancer (mother)     MEDICATIONS:   Fentanyl 75 mcg IV, Versed 7.5 mg IV           DESCRIPTION OF PROCEDURE:   After the risks benefits and     alternatives of the procedure were thoroughly explained, informed     consent was obtained.  Digital rectal exam was performed and     revealed no rectal masses.   The  endoscope was introduced through     the anus and advanced to the cecum, which was identified by both     the appendix and ileocecal valve, without limitations.  The     quality of the prep was good, using MoviPrep.  The instrument was     then slowly withdrawn as the colon was fully examined.     <<PROCEDUREIMAGES>>           FINDINGS:  Mild diverticulosis was found in the sigmoid to     descending colon segments (see image2).  Internal and external     hemorrhoids were found. These were small, not thrombosed.  A total     of four polyps were found, all were removed, all were sent to     pathology. One was 2cm, pedunculated, located in distal sigmoid     colon, removed with snare/cautery, sent to pathology (jar 1). The     other three were 2-36mm sessile polyps that appeared hyperplastic.     These were removed with cold snare, located in rectosigmoid, sent     to pathology (jar 2) (see image4 and image6).   Retroflexed views  in the rectum revealed no abnormalities.    The scope was then     withdrawn from the patient and the procedure completed.           COMPLICATIONS:  None           ENDOSCOPIC IMPRESSION:     1) Mild diverticulosis in the sigmoid to descending colon     segments     2) Small internal and external hemorrhoids     3) Four polyps found, one was >1cm, all were removed, all were     sent to pathology     4) Otherwise normal examination           RECOMMENDATIONS:     1) If the polyp(s) removed today are proven to be adenomatous     (pre-cancerous) polyps, you will need a colonoscopy in 3 years.     2)  You will receive a letter within 1-2 weeks with the results     of your biopsy as well as final recommendations. Please call my     office if you have not received a letter after 3 weeks.           ______________________________     Rachael Fee, MD           n.     eSIGNED:   Rachael Fee at 09/05/2009 10:51 AM           Octavio Graves, [id number]  Note: An exclamation mark (!) indicates a result that was not dispersed into the flowsheet. Document Creation Date: 09/05/2009 10:51 AM _______________________________________________________________________  (1) Order result status: Final Collection or observation date-time: 09/05/2009 10:44 Requested date-time:  Receipt date-time:  Reported date-time:  Referring Physician:   Ordering Physician: Rob Bunting [phone]) Specimen Source:  Source: Launa Grill Order Number: [id number] Lab site:

## 2010-04-10 NOTE — Assessment & Plan Note (Signed)
Summary: b12/cbs  VNurse isit  CC: Pt here for B12 injection, verified by Dr. Drue Novel.    Allergies: 1)  ! 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

## 2010-04-10 NOTE — Assessment & Plan Note (Signed)
Summary: TINNITIS OR POPPING OF THE EARS/KB   Vital Signs:  Patient profile:   65 year old male Weight:      169.38 pounds Temp:     98.1 degrees F oral Pulse rate:   82 / minute Pulse rhythm:   regular BP sitting:   134 / 72  (left arm) Cuff size:   regular  Vitals Entered By: Army Fossa CMA (March 25, 2010 7:57 AM) CC: Pt here c/o a "fluttering" sound in (R) ear. Comments x a few weeks. [facility] [place] fasting    History of Present Illness: his complaint today is a fluttering sound in the right ear, started few weeks ago,  sx are intermitent,not associated with headache, nausea or vomiting. 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 They found tubular adenomas, next colonoscopy 2014  Did not keep an appointment for a followup ultrasound due to renal cysts  history of vitamin D deficiency  ROS No ambulatory blood pressures No ear discharge or ear pain He was seen a few months ago with rectal bleeding, colonoscopy, no further bleeding still  smoking, "I'll do it till I die"  Current Medications (verified): 1)  Bayer Aspirin 325 Mg  Tabs (Aspirin) .Marland Kitchen.. 1 By Mouth Qd 2)  Felodipine 10 Mg  Tb24 (Felodipine) .Marland Kitchen.. 1 By Mouth Qd 3)  Furosemide 40 Mg Tabs (Furosemide) .Marland Kitchen.. 1 By Mouth Once Daily. Due For An Office Visit.  Allergies (verified): 1)  ! 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  Depression   Hyperhomocysteinemia.  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 Venetia Maxon in 1997. Shunt replaced 06-2008. 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  Physical Exam  General:  alert, well-developed, and well-nourished.   Ears:  right ear with some wax, it was carefully removed, I was able to visualize better the TM and that seems normal. No pain to percussion of the muscle the area on the right Nose:  not congested Neck:  normal carotid pulses Lungs:  normal respiratory effort, no intercostal retractions, no accessory muscle use, and normal breath sounds.   Heart:  normal rate, regular rhythm, and no murmur.     Impression & Recommendations:  Problem # 1:  TINNITUS (ICD-388.30) tinnitus , unclear etiology, I rec. a referal  to ENT but patient likes to wait few weeks; he believes removing wax (as i did)  may help. he will call if no better   Problem # 2:  RENAL INSUFFICIENCY (ICD-588.9) due for labs, see instructions   Problem # 3:  TOBACCO ABUSE (ICD-305.1) counseled  Problem # 4:  HYPERTENSION (ICD-401.9) no change  His updated medication list for this problem includes:    Felodipine 10 Mg Tb24 (Felodipine) .Marland Kitchen... 1 by mouth qd    Furosemide 40 Mg Tabs (Furosemide) .Marland Kitchen... 1 by mouth once daily. due for an office visit.  BP today: 134/72 Prior BP: 130/78 (09/03/2009)  Labs Reviewed: K+: 3.9 (09/04/2009) Creat: : 1.9 (09/04/2009)   Chol: 110 (12/30/2007)   HDL: 60.9 (12/30/2007)   LDL: 38 (12/30/2007)   TG: 55 (12/30/2007)  Complete Medication List: 1)  Bayer Aspirin 325 Mg Tabs (Aspirin) .Marland Kitchen.. 1 by mouth qd 2)  Felodipine 10 Mg Tb24 (Felodipine) .Marland Kitchen.. 1 by mouth qd 3)  Furosemide 40 Mg  Tabs (Furosemide) .Marland Kitchen.. 1 by mouth once daily. due for an office visit.  Patient Instructions: 1)  you are due for a physical, came back fasting  within 2 months    Orders Added: 1)  Est. Patient Level III [[address]]   Immunization History:  Influenza Immunization History:    Influenza:  recommended  (03/25/2010)   Immunization History:  Influenza Immunization History:    Influenza:  recommended  (03/25/2010)

## 2010-04-10 NOTE — Assessment & Plan Note (Signed)
Summary: b12 inj/cbs  Nurse Visit   Allergies: 1)  ! 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

## 2010-04-10 NOTE — Assessment & Plan Note (Signed)
Summary: B-12 SHOT///SPH  Nurse Visit   Allergies: 1)  ! 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

## 2010-04-10 NOTE — Progress Notes (Signed)
Summary: Refill Request  Phone Note Refill Request Message from:  Patient on [facility] Fax #: [phone]  Refills Requested: Medication #1:  FLEXERIL 10 MG TABS one by mouth at bedtime as needed for pain.   Dosage confirmed as above?Dosage Confirmed   Last Refilled: 04/10/2009 Initial call taken by: Harold Barban,  April 29, 2009 8:55 AM  Follow-up for Phone Call        was rx'd #21 on 04/10/09 Shary Decamp  April 29, 2009 9:07 AM  ok 30 and 3 RF Anntoinette Haefele E. Lucus Lambertson MD  April 29, 2009 9:43 AM     Prescriptions: FLEXERIL 10 MG TABS (CYCLOBENZAPRINE HCL) one by mouth at bedtime as needed for pain  #30 x 4   Entered by:   Shary Decamp   Authorized by:   Nolon Rod. Delrae Hagey MD   Signed by:   Shary Decamp on 04/29/2009   Method used:   Electronically to        [facility] [phone]* (retail)       [address] [place], [place]  [address]       Ph: [phone]       Fax: [phone]   RxID:   [id number]

## 2010-04-10 NOTE — Assessment & Plan Note (Signed)
History of Present Illness Visit Type: Initial Consult Primary GI MD: Rob Bunting MD Primary Provider: Nolon Rod. Paz MD Requesting Provider: Willow Ora, MD Chief Complaint: rectal bleeding x 1 month History of Present Illness:     very pleasant 65 yo man who feels extremely weak.  Started seeing rectal bleeding about 1 month ago.  He was seen in an ER recently, told he had anal fissure.  [place] CT also showed diverticulosis.  hemoglobin was 14.6. Not every day bleeding.  3-4 times in a week.  Only with a BM.  Has minor anal discomfort but there is a lot of pressure during a BM.    Does not tend to be constipated.  Rarely has to push or strain.    Overall minor weight loss.            Current Medications (verified): 1)  Bayer Aspirin 325 Mg  Tabs (Aspirin) .Marland Kitchen.. 1 By Mouth Qd 2)  Felodipine 10 Mg  Tb24 (Felodipine) .Marland Kitchen.. 1 By Mouth Qd 3)  Furosemide 40 Mg Tabs (Furosemide) .Marland Kitchen.. 1 By Mouth Once Daily 4)  Aricept 10 Mg Tabs (Donepezil Hcl) .Marland Kitchen.. 1 By Mouth Once Daily  Allergies (verified): 1)  ! 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 Venetia Maxon in 1997. 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  Review of Systems   Pertinent positive and negative review of systems were noted in the above HPI and GI specific review of systems.  All other review of systems was otherwise negative.   Vital Signs:  Patient profile:   65 year old male Height:      67 inches Weight:      170.25 pounds BMI:     26.76 Pulse rate:   68 / minute Pulse rhythm:   regular BP sitting:   130 / 78  (left arm) Cuff size:   regular  Vitals Entered By: June McMurray CMA Duncan Dull) (September 03, 2009 8:08 AM)  Physical Exam  Additional Exam:  Constitutional: generally well appearing Psychiatric: alert and oriented times 3 Eyes: extraocular movements intact Mouth: oropharynx moist, no lesions Neck: supple, no lymphadenopathy Cardiovascular: heart regular rate and rythm Lungs: CTA bilaterally Abdomen: soft, non-tender, non-distended, no obvious ascites, no peritoneal signs, normal bowel sounds Extremities: no lower extremity edema bilaterally Skin: no lesions on visible extremities anorectal exam: small to medium sized external hemorrhoids, brown stool, not Hemoccult checked, 5-10 mm nodule in the anterior distal rectum palpated, this does not feel firm or fixed.    Impression & Recommendations:  Problem # 1:  minor rectal bleeding this is likely hemorrhoidal however we need to make sure nothing more serious is going on. I did palpate a small nodule  is anterior rectum. Perhaps this is a small polyp. He has a family history of colon cancer and has never had colonoscopy. We will arrange for that to be done at his soonest convenience.  Patient Instructions: 1)  You will be scheduled to have a colonoscopy. 2)  A copy of this information will be sent to Dr. Drue Novel. 3)  The medication list was reviewed and reconciled.  All changed / newly prescribed medications were explained.  A complete medication list was provided to the patient / caregiver.  Appended Document: Orders Update/movi    Clinical Lists Changes  Medications: Added new  medication of MOVIPREP 100 GM  SOLR (PEG-KCL-NACL-NASULF-NA ASC-C) As per prep instructions. - Signed Rx of MOVIPREP 100 GM  SOLR (PEG-KCL-NACL-NASULF-NA ASC-C) As per prep instructions.;  #1 x 0;  Signed;  Entered by: Chales Abrahams CMA (AAMA);  Authorized by: Rachael Fee MD;  Method used: Electronically to [facility] [address], [place], [place]  [address], Ph: [phone], Fax: [phone] Orders: Added new Test order of ZCOL (ZCOL) - Signed    Prescriptions: MOVIPREP 100 GM  SOLR (PEG-KCL-NACL-NASULF-NA ASC-C) As per prep instructions.  #1 x 0   Entered by:   Chales Abrahams CMA (AAMA)   Authorized by:   Rachael Fee MD   Signed by:   Chales Abrahams CMA (AAMA) on 09/03/2009   Method used:   Electronically to        [facility] [phone]* (retail)       [address] [place], [place]  [address]       Ph: [phone]       Fax: [phone]   RxID:   [id number]

## 2010-04-10 NOTE — Assessment & Plan Note (Signed)
Summary: blood in stool/drb   Vital Signs:  Patient profile:   65 year old male Weight:      164 pounds Temp:     98.6 degrees F oral Pulse rate:   90 / minute Pulse rhythm:   regular BP sitting:   122 / 74  (left arm) Cuff size:   regular  Vitals Entered By: Army Fossa CMA (May 24, 2009 11:48 AM) CC: Pt c/o blood in stool once.   History of Present Illness: Pt walked in c/o blood in stool x1 last week.  He states he has had some abd discomfort in past.  No constipation or straining.    No other symptoms.     Preventive Screening-Counseling & Management  Alcohol-Tobacco     Smoking Status: current     Packs/Day: 2  Current Medications (verified): 1)  Bayer Aspirin 325 Mg  Tabs (Aspirin) .Marland Kitchen.. 1 By Mouth Qd 2)  Felodipine 10 Mg  Tb24 (Felodipine) .Marland Kitchen.. 1 By Mouth Qd 3)  Furosemide 40 Mg Tabs (Furosemide) .Marland Kitchen.. 1 By Mouth Once Daily 4)  Aricept 10 Mg Tabs (Donepezil Hcl) .Marland Kitchen.. 1 By Mouth Once Daily 5)  Meclizine Hcl 12.5 Mg Tabs (Meclizine Hcl) .Marland Kitchen.. 1 By Mouth Every 6 Hours As Needed Dizziness 6)  Promethazine Hcl 12.5 Mg Tabs (Promethazine Hcl) .Marland Kitchen.. 1 By Mouth Every 6 Hours As Needed 7)  Hydromet 5-1.5 Mg/59ml Syrp (Hydrocodone-Homatropine) .... 5cc By Mouth Qid As Needed For Severe Cough 8)  Chantix Starting Month Pak 0.5 Mg X 11 & 1 Mg X 42 Tabs (Varenicline Tartrate) .... As Directed 9)  Chantix 1 Mg Tabs (Varenicline Tartrate) .Marland Kitchen.. 1 By Mouth Two Times A Day X 2 Months 10)  Flexeril 10 Mg Tabs (Cyclobenzaprine Hcl) .... One By Mouth At Bedtime As Needed For Pain 11)  Aciphex 20 Mg Tbec (Rabeprazole Sodium) .Marland Kitchen.. 1 By Mouth Once Daily  Allergies: 1)  ! * Chantix  Past History:  Past medical, surgical, family and social histories (including risk factors) reviewed for relevance to current acute and chronic problems.  Past Medical History: Reviewed history from 11/15/2008 and no changes required. 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 Venetia Maxon in 1997. 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  Depression  Past Surgical History: Reviewed history from 10/29/2008 and no changes required. s/p remobal of swallowed of a FB from the stomach when he was 65 y/o (laparotomy)  Family History: Reviewed history from 12/16/2007 and no changes required. MI  (-)  DM (-)  Colon ca (-)  prostate ca (-)   Social History: Reviewed history from 11/15/2008 and no changes required. Single 2 daughters Works in [place] tobacco > 1 PPD lives w/ daughter [name]  Review of Systems      See HPI  Physical Exam  General:  Well-developed,well-nourished,in no acute distress; alert,appropriate and cooperative throughout examination Abdomen:  Bowel sounds positive,abdomen soft and non-tender without masses, organomegaly or hernias noted. Rectal:  no external abnormalities, no hemorrhoids, normal sphincter tone, no masses, and stool positive for occult blood.   Psych:  Oriented X3 and normally interactive.     Impression & Recommendations:  Problem # 1:  RECTAL  BLEEDING (ICD-569.3) aciphex 1 by mouth once daily  GI referral check labs  If pt becomes symptomatic---go to ER Orders: Venipuncture ([address]) TLB-CBC Platelet - w/Differential (85025-CBCD) Gastroenterology Referral (GI)  Complete Medication List: 1)  Bayer Aspirin 325 Mg Tabs (Aspirin) .Marland Kitchen.. 1 by mouth qd 2)  Felodipine 10 Mg Tb24 (Felodipine) .Marland Kitchen.. 1 by mouth qd 3)  Furosemide 40 Mg Tabs (Furosemide) .Marland Kitchen.. 1 by mouth once daily 4)  Aricept 10 Mg Tabs (Donepezil hcl) .Marland Kitchen.. 1 by mouth once daily 5)  Meclizine Hcl 12.5 Mg Tabs (Meclizine hcl) .Marland Kitchen.. 1 by mouth every 6 hours as needed  dizziness 6)  Promethazine Hcl 12.5 Mg Tabs (Promethazine hcl) .Marland Kitchen.. 1 by mouth every 6 hours as needed 7)  Hydromet 5-1.5 Mg/58ml Syrp (Hydrocodone-homatropine) .... 5cc by mouth qid as needed for severe cough 8)  Chantix Starting Month Pak 0.5 Mg X 11 & 1 Mg X 42 Tabs (Varenicline tartrate) .... As directed 9)  Chantix 1 Mg Tabs (Varenicline tartrate) .Marland Kitchen.. 1 by mouth two times a day x 2 months 10)  Flexeril 10 Mg Tabs (Cyclobenzaprine hcl) .... One by mouth at bedtime as needed for pain 11)  Aciphex 20 Mg Tbec (Rabeprazole sodium) .Marland Kitchen.. 1 by mouth once daily  Patient Instructions: 1)  Take Aciphex 1 by mouth once daily  2)  we are setting up a GI appointment for you---Renee will call you when appointment is set up

## 2010-04-10 NOTE — Progress Notes (Signed)
Summary: ringing in ears  Phone Note Call from Patient   Caller: Daughter Summary of Call: Pt daughter called and states- He is having some tinnitus in his left ear and a "throbbing" noise in his right ear.  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 Please call pt and see if he will schedule an appt. Army Fossa CMA  March 24, 2010 8:35 AM   Follow-up for Phone Call        I spoke with the patient and he will come in tomorrow. Lucious Groves CMA  March 24, 2010 9:24 AM

## 2010-04-10 NOTE — Progress Notes (Signed)
Summary: Dizzines- FYI   Phone Note Call from Patient   Caller: Daughter Summary of Call: Pts daugther called and states that they are leaving at 11am for a cruise and her dad woke up dizzy. She states that she needs Meclizine called into the pharmacy-- it was removed from his med list she states he takes it as needed. I called into the [facility] in [place] and added med back to pts med list. Army Fossa CMA  November 25, 2009 8:23 AM     New/Updated Medications: MECLIZINE HCL 12.5 MG TABS (MECLIZINE HCL) 1 by mouth q 6 hrs as needed for dizziness. Prescriptions: MECLIZINE HCL 12.5 MG TABS (MECLIZINE HCL) 1 by mouth q 6 hrs as needed for dizziness.  #20 x 0   Entered by:   Army Fossa CMA   Authorized by:   Nolon Rod. Keely Drennan MD   Signed by:   Nolon Rod. Nakoma Gotwalt MD on 11/25/2009   Method used:   Telephoned to ...       [address] [phone]* (retail)       [address] [place], [place]  [address]       Ph: [phone]       Fax: [phone]   RxID:   [id number]

## 2010-04-10 NOTE — Assessment & Plan Note (Signed)
Summary: B12/DRB  Nurse Visit  CC: B-12 inj./kb   Allergies: 1)  ! 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

## 2010-04-10 NOTE — Progress Notes (Signed)
Summary: Appt. ASAP  Phone Note From Other Clinic   [facility]ene [facility]ini [facility]  [phone] Call For: Dr Russella Dar Summary of Call: Office is wanting an appt. ASAP. Pt. has positive blood in stool and rectal bleeding Initial call taken by: Karna Christmas,  May 24, 2009 12:03 PM  Follow-up for Phone Call        NP3 with Mike Gip PA tomorrow at 8/:30.  Renee aware. Follow-up by: Darcey Nora RN, CGRN,  May 27, 2009 10:49 AM

## 2010-04-10 NOTE — Progress Notes (Signed)
Summary: imaging report  ---- Converted from flag ---- ---- 09/01/2009 12:59 PM, Jose E. Paz MD wrote: .  Please asked the patient's daughter, [name], to get the actual imaging as both his father's CT of the abdomen don't [place].  They need to be sent to our radiology department for comparison.  He had a renal cyst that was previously seen ------------------------------  left message to call office on pt daughter cell phone.Felecia Deloach CMA  September 03, 2009 8:27 AM  Pts daughter is aware. Army Fossa CMA  September 03, 2009 8:47 AM

## 2010-04-10 NOTE — Progress Notes (Signed)
Summary: GI Concerns  Phone Note Call from Patient   Caller: Patient's Daughter  ~ Arline Asp Summary of Call: Patient's daughter took patient to hospital [facility] for blood in stool begining of June, Bood in toilet. Last supp on Sunday and patient thinks the bleeding is coming back. Patient was dx with an anal fissure. Dr. Drue Novel found a polyp at last visit.  Mother died from colon CA. Patient would really like to have appt moved up if possible. They also were given a number of a GI specialist in [place] that they might call if they can't see [facility] soon enough.  Initial call taken by: Harold Barban,  September 02, 2009 1:11 PM

## 2010-04-10 NOTE — Assessment & Plan Note (Signed)
Summary: b12 inj//lch  Nurse Visit   Allergies: 1)  ! 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

## 2010-04-10 NOTE — Assessment & Plan Note (Signed)
Summary: B-12 SHOT///SPH  Nurse Visit  CC: B-12 inj./kb   Allergies: 1)  ! 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

## 2010-04-10 NOTE — Assessment & Plan Note (Signed)
Summary: b-12/cbs  Nurse Visit   Allergies: 1)  ! 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

## 2010-04-10 NOTE — Assessment & Plan Note (Signed)
Summary: B12/KN  Nurse Visit  CC: Vitamin B-12 inj./kb   Allergies: 1)  ! 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

## 2010-04-10 NOTE — Assessment & Plan Note (Signed)
Summary: Still having blood in stool/drb--Rm 13   Vital Signs:  Patient profile:   65 year old male Height:      69 inches Weight:      174.04 pounds BMI:     25.79 Temp:     98.4 degrees F oral Pulse rate:   72 / minute Pulse rhythm:   regular Resp:     16 per minute BP sitting:   126 / 80  (left arm) Cuff size:   regular  Vitals Entered By: Mervin Kung CMA (August 30, 2009 11:29 AM) CC: rM 13   Pt having bright red blood with bowel movement x 2 weeks.  Is Patient Diabetic? No Comments Pt states he has completed : Meclizine, Promethazine, Hydromet, Chantix, Flexeril and Aciphex. Stopped Chantix due to fatigue and nausea.  All other med doses and directions are correct.   History of Present Illness: continue with red- fresh blood per rectum. She saw Dr. Laury Axon  a month ago, was referred to GI but did not keep the appointment States his  symptoms increased  for 3 weeks but they have  slow down for 2 days  he is also feeling weak, the way he describes this is "I need to sit down after a while because my legs get weak". He  denies pain in the knees as a cause for him to sit down Apparently is just generalized lack of energy  he went to the  ER 08-21-09 with above  symptoms. a number of records are reviewed (part of the pages were cut off but they were readable ): CT of the abdomen and pelvis without IV contrast column Diverticulosis Probably cyst within the right kidney, they recommended an ultrasound to confirm Liver cysts labs:  WBC 4.6, hemoglobin 14.6, platelets 192. urinalysis essentially negative Potassium 3.9, creatinine 1.9 LFTs normal BNP 93 which is normal  EKG shows sinus bradycardia, rate 45, no acute changes, and no changes compared to previous EKGs    He was told he has a anal  fissure.  ROS No fever Denies any chest pain or shortness of breath Orthopnea  No dyspnea on exertion with activities of daily living Admits to mild abdominal discomfort without  nausea, vomiting, heartburn  Allergies: 1)  ! * Chantix  Past History:  Past Medical History: Reviewed history from 11/15/2008 and no changes required. 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 Venetia Maxon in 1997. 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  Depression  Past Surgical History: Reviewed history from 10/29/2008 and no changes required. s/p remobal of swallowed of a FB from the stomach when he was 65 y/o (laparotomy)  Social History: Reviewed history from 11/15/2008 and no changes required. 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   Lungs:  normal respiratory effort, no intercostal retractions, no accessory muscle use, and normal breath sounds.   Heart:  normal rate, regular rhythm, and no murmur.   Abdomen:  soft, no distention, no masses, no guarding, and no rigidity.  abdomen he is inconsistently tender to palpation. The tenderness is very mild and less  when the patient is distracted  Rectal:  No external abnormalities noted. Normal sphincter tone. I felt  a polyp-like lesion at 6 oclock, size approximately 1 cm anoscopy: Mucosa normal, the lesion is confirmed ( polyp versus skin tag), no active bleeding, few small hemorrhoids Prostate:  Prostate gland firm and smooth, no enlargement, nodularity, tenderness, mass, asymmetry or induration.   Impression & Recommendations:  Problem # 1:  RECTAL BLEEDING (ICD-569.3) bright red blood per rectum for a while On exam he does have a polyp Plan: all  ER records reviewed  GI appointment in July,  rec to keep it  Hemoglobin today is normal Discuss with the patient and daughter  Problem # 2:  FATIGUE (ICD-780.79) reassess after rectal bleed is evaluated by GI recent labs normal   Orders: Venipuncture ([address]) TLB-B12 + Folate Pnl (82746_82607-B12/FOL) T-Vitamin D (25-Hydroxy) (01027-25366)  Problem # 3:  RENAL CYST (ICD-593.2)  right kidney cysts, incidental finding on CT.  Radiology recommended ultrasound  chart review it, he had a renal ultrasound on September 2007---> "No hydronephrosis.  2.4cm right renal cyst." Plan: Get actual images from [facility] and sent it to our radiologist.  Will notify the daughter  Complete Medication List: 1)  Bayer Aspirin 325 Mg Tabs (Aspirin) .Marland Kitchen.. 1 by mouth qd 2)  Felodipine 10 Mg Tb24 (Felodipine) .Marland Kitchen.. 1 by mouth qd 3)  Furosemide 40 Mg Tabs (Furosemide) .Marland Kitchen.. 1 by mouth once daily 4)  Aricept 10 Mg Tabs (Donepezil hcl) .Marland Kitchen.. 1 by mouth once daily  Patient Instructions: 1)  Please schedule a follow-up appointment in 3 months .   Current Allergies (reviewed today): ! * CHANTIX   Orders Added: 1)  Venipuncture [36415] 2)  TLB-B12 + Folate Pnl [82746_82607-B12/FOL] 3)  T-Vitamin D (25-Hydroxy) [44034-74259] 4)  Est. Patient Level IV [56387]   Laboratory Results   CBC   HGB:  14.3 g/dL   (Normal Range: 56.4-33.2 in Males, 12.0-15.0 in Females)

## 2010-04-10 NOTE — Assessment & Plan Note (Signed)
Summary: B12/KN  Nurse Visit   Allergies: 1)  ! 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

## 2010-04-10 NOTE — Progress Notes (Signed)
Summary: No Show Fee  Phone Note Call from Patient   Caller: Patient Call For: Jose E. Paz MD Summary of Call: Pts daughter called to apologize for the No Show on Friday. Pt thought his daughter called to cancel the appt, and she thought he called. She is concerned about the No Show Fee, and if you will consider waiving the fee? Army Fossa CMA  August 12, 2009 4:50 PM   Follow-up for Phone Call        NO late fee [place] E. Paz MD  August 12, 2009 5:00 PM

## 2010-04-10 NOTE — Procedures (Signed)
Summary: Prep/[facility]  Prep/[facility]   Imported By: [place] 09/05/2009 07:09:34  _____________________________________________________________________  External Attachment:    Type:   Image     Comment:   External Document

## 2010-04-10 NOTE — Progress Notes (Signed)
Summary: Lorain Childes- pt going to hospital  Phone Note Call from Patient Call back at [phone]   Caller: Daughter Summary of Call: Pts daughter called and stated that her father told her that the blood in his stool was gone. He No showed to his colonoscopy. Pt told his daughters husband that he was still having the blood in the stool and that he is extremely weak. The daughter is going to take him to the new hospital in [place] now. She would like to know if you have any advice for her. Army Fossa CMA  August 21, 2009 10:37 AM   Follow-up for Phone Call        if he is extremely weak, dizziness--- go to the ER  If his only fatigue and not feeling too well with  could see him  here today (as early as possible) Coryn Mosso E. Marque Rademaker MD  August 21, 2009 10:52 AM   Additional Follow-up for Phone Call Additional follow up Details #1::        Pts daughter said he was extremely weak, did not notice paleness, she said he is always dizzy so she is unsure if it is different than normal. Pts daughter was pulling into the ER so she is going to take him there. She will keep [place] posted she states. Army Fossa CMA  August 21, 2009 10:57 AM

## 2010-04-10 NOTE — Letter (Signed)
Summary: Call a Nurse  Call a Nurse   Imported By: Lanelle Bal 12/31/2009 14:03:13  _____________________________________________________________________  External Attachment:    Type:   Image     Comment:   External Document

## 2010-04-10 NOTE — Letter (Signed)
Summary: Results Letter  [facility]  [address] [place], [place] [address]   Phone: [phone]  Fax: [phone]        September 11, 2009 MRN: [id number]    Zachary Fields [address] [place], [place]  [address]    Dear Mr. DUDEK,    The polyp(s) removed during your recent procedure were proven to be adenomatous.  These are pre-cancerous polyps that may have grown into cancers if they had not been removed.  Based on current nationally recognized surveillance guidelines, I recommend that you have a repeat colonoscopy in 3 years.   We will therefore put your information in our reminder system and will contact you in 3 years to schedule a repeat procedure.  Please call if you have any questions or concerns.      Sincerely,  Rachael Fee MD  This letter has been electronically signed by your physician.  Appended Document: Results Letter letter mailed

## 2010-04-10 NOTE — Assessment & Plan Note (Signed)
Summary: pt check///sph  Nurse Visit  CC: B-12 inj/kb   Allergies: 1)  ! 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

## 2010-04-18 ENCOUNTER — Ambulatory Visit (INDEPENDENT_AMBULATORY_CARE_PROVIDER_SITE_OTHER): Payer: BC Managed Care – PPO

## 2010-04-18 ENCOUNTER — Encounter: Payer: Self-pay | Admitting: Internal Medicine

## 2010-04-18 DIAGNOSIS — D508 Other iron deficiency anemias: Secondary | ICD-10-CM

## 2010-04-30 NOTE — Assessment & Plan Note (Signed)
Summary: b12 inj/lch  Nurse Visit   Allergies: 1)  ! 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 Per Dr Drue Novel ok to given injection.Felecia Deloach CMA  April 18, 2010 11:15 AM     Patient tolerated injection without complications    Given by: Jeremy Johann CMA (April 18, 2010 11:12 AM)  Orders Added: 1)  Vit B12 1000 mcg [J3420] 2)  Admin of Therapeutic Inj  intramuscular or subcutaneous [[id number]]

## 2010-05-16 ENCOUNTER — Ambulatory Visit: Payer: BC Managed Care – PPO

## 2010-05-19 ENCOUNTER — Encounter: Payer: Self-pay | Admitting: Internal Medicine

## 2010-05-19 ENCOUNTER — Ambulatory Visit (INDEPENDENT_AMBULATORY_CARE_PROVIDER_SITE_OTHER): Payer: BC Managed Care – PPO

## 2010-05-19 DIAGNOSIS — D508 Other iron deficiency anemias: Secondary | ICD-10-CM

## 2010-05-25 LAB — COMPREHENSIVE METABOLIC PANEL
ALT: 14 U/L (ref 0–53)
Albumin: 3.9 g/dL (ref 3.5–5.2)
Alkaline Phosphatase: 76 U/L (ref 39–117)
BUN: 19 mg/dL (ref 6–23)
GFR calc Af Amer: 42 mL/min — ABNORMAL LOW (ref >60)
Potassium: 3.8 mEq/L (ref 3.5–5.1)
Sodium: 140 mEq/L (ref 135–145)
Total Protein: 6.7 g/dL (ref 6.0–8.3)

## 2010-05-25 LAB — URINALYSIS, ROUTINE W REFLEX MICROSCOPIC
Bilirubin Urine: NEGATIVE
Specific Gravity, Urine: 1.015 (ref 1.005–1.030)
Urobilinogen, UA: 0.2 mg/dL (ref 0.0–1.0)

## 2010-05-25 LAB — DIFFERENTIAL
Basophils Relative: 0 % (ref 0–1)
Monocytes Absolute: 0.4 10*3/uL (ref 0.1–1.0)
Monocytes Relative: 6 % (ref 3–12)
Neutro Abs: 4.8 10*3/uL (ref 1.7–7.7)

## 2010-05-25 LAB — URINE MICROSCOPIC-ADD ON

## 2010-05-25 LAB — CBC
HCT: 43 % (ref 39.0–52.0)
Hemoglobin: 14.8 g/dL (ref 13.0–17.0)
RBC: 4.65 MIL/uL (ref 4.22–5.81)
WBC: 6.2 10*3/uL (ref 4.0–10.5)

## 2010-05-27 NOTE — Assessment & Plan Note (Signed)
Summary: B12 SHOT  Nurse Visit   Allergies: 1)  ! 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

## 2010-06-11 ENCOUNTER — Other Ambulatory Visit: Payer: Self-pay | Admitting: Internal Medicine

## 2010-06-11 LAB — COMPREHENSIVE METABOLIC PANEL
AST: 18 U/L (ref 0–37)
Alkaline Phosphatase: 80 U/L (ref 39–117)
BUN: 19 mg/dL (ref 6–23)
BUN: 21 mg/dL (ref 6–23)
CO2: 27 mEq/L (ref 19–32)
CO2: 28 mEq/L (ref 19–32)
Calcium: 8.8 mg/dL (ref 8.4–10.5)
Chloride: 104 mEq/L (ref 96–112)
Creatinine, Ser: 1.87 mg/dL — ABNORMAL HIGH (ref 0.4–1.5)
GFR calc Af Amer: 44 mL/min — ABNORMAL LOW (ref >60)
GFR calc non Af Amer: 37 mL/min — ABNORMAL LOW (ref >60)
Glucose, Bld: 131 mg/dL — ABNORMAL HIGH (ref 70–99)
Potassium: 3.3 mEq/L — ABNORMAL LOW (ref 3.5–5.1)
Total Bilirubin: 0.9 mg/dL (ref 0.3–1.2)

## 2010-06-11 LAB — POCT CARDIAC MARKERS
CKMB, poc: <1 ng/mL — ABNORMAL LOW (ref 1.0–8.0)
Myoglobin, poc: 139 ng/mL (ref 12–200)
Myoglobin, poc: 94.9 ng/mL (ref 12–200)
Troponin i, poc: <0.05 ng/mL (ref 0.00–0.09)

## 2010-06-11 LAB — DIFFERENTIAL
Basophils Absolute: 0 10*3/uL (ref 0.0–0.1)
Basophils Absolute: 0 10*3/uL (ref 0.0–0.1)
Basophils Relative: 0 % (ref 0–1)
Eosinophils Absolute: 0 10*3/uL (ref 0.0–0.7)
Eosinophils Relative: 2 % (ref 0–5)
Lymphocytes Relative: 17 % (ref 12–46)
Lymphs Abs: 1.1 10*3/uL (ref 0.7–4.0)
Neutro Abs: 5.1 10*3/uL (ref 1.7–7.7)
Neutro Abs: 6 10*3/uL (ref 1.7–7.7)
Neutrophils Relative %: 82 % — ABNORMAL HIGH (ref 43–77)

## 2010-06-11 LAB — URINALYSIS, ROUTINE W REFLEX MICROSCOPIC
Bilirubin Urine: NEGATIVE
Ketones, ur: NEGATIVE mg/dL
Leukocytes, UA: NEGATIVE
Nitrite: NEGATIVE
Protein, ur: NEGATIVE mg/dL
Urobilinogen, UA: 0.2 mg/dL (ref 0.0–1.0)
pH: 6.5 (ref 5.0–8.0)

## 2010-06-11 LAB — CBC
HCT: 44.7 % (ref 39.0–52.0)
HCT: 47 % (ref 39.0–52.0)
Hemoglobin: 16 g/dL (ref 13.0–17.0)
MCHC: 34.4 g/dL (ref 30.0–36.0)
MCV: 91.9 fL (ref 78.0–100.0)
Platelets: 179 10*3/uL (ref 150–400)
RBC: 4.86 MIL/uL (ref 4.22–5.81)
RBC: 5.08 MIL/uL (ref 4.22–5.81)
RDW: 14.6 % (ref 11.5–15.5)
WBC: 7.3 10*3/uL (ref 4.0–10.5)

## 2010-06-18 LAB — CBC
HCT: 46.2 % (ref 39.0–52.0)
Hemoglobin: 15.9 g/dL (ref 13.0–17.0)
MCHC: 34.5 g/dL (ref 30.0–36.0)
RBC: 5.11 MIL/uL (ref 4.22–5.81)
RDW: 14.7 % (ref 11.5–15.5)

## 2010-06-18 LAB — BASIC METABOLIC PANEL
CO2: 28 mEq/L (ref 19–32)
Calcium: 9.4 mg/dL (ref 8.4–10.5)
Chloride: 104 mEq/L (ref 96–112)
GFR calc Af Amer: 41 mL/min — ABNORMAL LOW (ref >60)
Glucose, Bld: 114 mg/dL — ABNORMAL HIGH (ref 70–99)
Potassium: 4.1 mEq/L (ref 3.5–5.1)
Sodium: 139 mEq/L (ref 135–145)

## 2010-06-19 LAB — CREATININE, SERUM
Creatinine, Ser: 2.06 mg/dL — ABNORMAL HIGH (ref 0.4–1.5)
GFR calc Af Amer: 40 mL/min — ABNORMAL LOW (ref >60)

## 2010-06-20 ENCOUNTER — Ambulatory Visit (INDEPENDENT_AMBULATORY_CARE_PROVIDER_SITE_OTHER): Payer: BC Managed Care – PPO | Admitting: *Deleted

## 2010-06-20 DIAGNOSIS — E538 Deficiency of other specified B group vitamins: Secondary | ICD-10-CM

## 2010-06-20 MED ORDER — CYANOCOBALAMIN 1000 MCG/ML IJ SOLN
1000.0000 ug | Freq: Once | INTRAMUSCULAR | Status: AC
  Administered 2010-06-20: 1000 ug via INTRAMUSCULAR

## 2010-06-28 ENCOUNTER — Other Ambulatory Visit: Payer: Self-pay | Admitting: Internal Medicine

## 2010-07-12 ENCOUNTER — Other Ambulatory Visit: Payer: Self-pay | Admitting: Internal Medicine

## 2010-07-18 ENCOUNTER — Ambulatory Visit (INDEPENDENT_AMBULATORY_CARE_PROVIDER_SITE_OTHER): Payer: BC Managed Care – PPO | Admitting: *Deleted

## 2010-07-18 DIAGNOSIS — E538 Deficiency of other specified B group vitamins: Secondary | ICD-10-CM

## 2010-07-18 MED ORDER — CYANOCOBALAMIN 1000 MCG/ML IJ SOLN
1000.0000 ug | Freq: Once | INTRAMUSCULAR | Status: AC
  Administered 2010-07-18: 1000 ug via INTRAMUSCULAR

## 2010-07-22 NOTE — Op Note (Signed)
[name], [name]               ACCOUNT NO.:  [id number]   MEDICAL RECORD NO.:  [id number]          PATIENT TYPE:  INP   LOCATION:  3101                         FACILITY:  [facility]   PHYSICIAN:  Danae Orleans. Venetia Maxon, M.D.  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   PROCEDURES:  1. Frameless stereotactic cyst aspiration.  2. Cyst to peritoneal shunt placement.   SURGEON:  Danae Orleans. Venetia Maxon, MD   ASSISTANTS:  1. Stefani Dama, MD  2. Georgiann Cocker, RN   ANESTHESIA:  General endotracheal anesthesia.   ESTIMATED BLOOD LOSS:  Minimal.   COMPLICATIONS:  None.   DISPOSITION:  Recovery.   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  He has had  complex shunting for hydrocephalus and has a new expanding right  peripineal cyst with midbrain compression.  It  was elected take him to  Surgery for cyst to peritoneal shunt placement with frameless  stereotactic aspiration of the cyst.   PROCEDURE IN DETAIL:  Zachary Fields was brought to the operating room.  Following the satisfactory and uncomplicated induction of general  endotracheal anesthesia, the patient was placed with the left side of  his head on a horseshoe head holder and right side of his chest bumped  on a sheet roll.  His brain MRI had been previously registered on the  [employer] and using the [employer], the  registration was performed of his scalp and then the planned parietal  entry point was identified.  His head, neck, chest, and abdomen were  prepped and draped in the usual sterile fashion with Betadine scrub and  paint after shaving his abdomen.  His right  upper quadrant incision was  marked as area of planned opening of this.  The skull was also marked,  and infiltrated with lidocaine with epinephrine.  Using large  curvilinear incision overlying the parietal entry point, the calvarium  was dissected free.  A bur hole was placed.  Then simultaneously, Dr.  Danielle Dess exposed the peritoneal cavity.  A Codman programmable shunt with  Rickham reservoir was programmed to 80 mm and was then tunneled through  subcutaneous tunnel to the abdominal incision with a separate stab  incision just in the right posterior auricular region.  The frameless  stereotactic guide assembly was then anchored to the skull and using the  [employer] Electromagnetic Registration System, the shunt catheter was  inserted after locking on the proper trajectory and incising the dura.  The shunt catheter was inserted into the tumor cyst.  There was  resistance felt at the wall of the cyst which appeared to correspond to  the imaging abnormality and Stealth guidance and then there was release  of some proteinaceous fluid and then also what appeared to be free flow  of CSF like  fluid.  Dr. Danielle Dess anchored the catheter while I  disassembled the guide assembly and then we cut the catheter at the  proper length and hooked it to the Rickham reservoir and a 2-0 silk  anchoring tie was placed.  There was spontaneous flow of CSF from the  distal tubing and the catheter was then inserted into the abdomen.  The  incisions were closed with 2-0 Vicryl sutures.  A purse-string was  placed over the peritoneal entry site.  The subcutaneous tissues were  approximated with 2-0 Vicryl sutures and then subcuticular Vicryl stitch  at the skin edge of the abdomen.  The cranial incisions were of closed  with running locked nylon stitches.  The wounds were dressed with  sterile occlusive dressings.  The patient was extubated in the operating  table and taken to the recovery in stable and satisfactory  condition  having tolerated his operation well.  Counts were correct at the end of  the case.      Danae Orleans. Venetia Maxon, M.D.  Electronically Signed     JDS/MEDQ  D:  06/28/2008  T:  06/28/2008  Job:  [id number]

## 2010-07-22 NOTE — H&P (Signed)
Zachary Fields, Zachary Fields               ACCOUNT NO.:  [id number]   MEDICAL RECORD NO.:  [id number]          PATIENT TYPE:  INP   LOCATION:  6737                         FACILITY:  [facility]   PHYSICIAN:  Casimiro Needle L. Reynolds, M.D.DATE OF BIRTH:  June 19, 1945   DATE OF ADMISSION:  10/27/2006  DATE OF DISCHARGE:                              HISTORY & PHYSICAL   CHIEF COMPLAINT:  Numbness and abnormal MRI.   HISTORY OF PRESENT ILLNESS:  This is an inpatient stroke service  admission for this 65 year old man with a past medical history that  includes hypertension and a previous brain tumor involving the pineal  area.  The patient began complaining of dizziness and feeling unwell  about 10 days ago.  He was seen by a Dr. Tresa Garter and his primary  neurologist, Dr. Sandria Manly, on August 11.  At that time he was felt to have  vertigo and was placed on Meclizine.  He says that since that time he  has not really felt much better.  He has continued to have some nausea  and dizzy sensations.  Today he has also felt numb and tingling by  which he means a tingling sensation all over, especially involving the  lower extremities, but not lateralized to one side or the other.  His  daughter felt that he has some slurred speech this morning.  He actually  was scheduled to have an outpatient MRI of the brain this morning, but  he says that he felt too unwell to have that done.  Instead he was  brought by his daughter to the emergency department, where he received  no treatment, at which time he was feeling somewhat better.  While he  was in the emergency department he had the MRI as was planned.  This was  read out as showing a very tiny (2 mm) acute stroke on the right  subcortical area around the external capsule.  Because of his known  vascular risk factors, he was admitted for further evaluation.  He feels  that his symptoms are stable at this time.  His symptoms have been  severe enough to interfere with his going to  work in the past 10 days.   PAST MEDICAL HISTORY:  He denies any known history of stroke.  He has a  history of a pineal tumor which dates back many years.  He has received  radiation therapy for this in the past and has had multiple shunts, the  most recent of which was revised in 1997 by Dr. Venetia Maxon.  He has  complained of memory loss, possibly related to his history of radiation  therapy, of which he is on Aricept.  He also has history of  hypertension.  He denies any history of diabetes, cholesterol problems  or heart disease.  It looks like he has some renal insufficiency  according to his old records, although he is not really aware of this.   MEDICATION:  1. Felodipine 10 mg daily.  2. Benazepril 10 mg daily.  3. Aricept 10 mg daily.  4. Triamterene/HCTZ 37.5/25 mg daily.  5. Meclizine 25  mg q.6 hours p.r.n.   ALLERGIES:  NO KNOWN DRUG ALLERGIES.   FAMILY HISTORY:  Negative for stroke.  Positive for hypertension and  kidney disease.   SOCIAL HISTORY:  He smokes heavily, about 2 packs per day.  He denies  using illicit drugs.  He has been working until recently.   REVIEW OF SYSTEMS:  NEUROLOGIC:  As above.  He also has a little bit of  a tremor according to Dr. Imagene Gurney notes.  CV:  He reports slight chest  pain today.  PULMONARY:  Dyspnea on exertion which is stable.  GI:  Nausea, vomiting, abdominal pain for the last few days.  GU:  Hesitancy  which is longstanding.  MUSCULOSKELETAL/ENDOCRINE/PSYCH/ALLERGIC/HEMATOLOGY:  All negative.   PHYSICAL EXAMINATION:  VITAL SIGNS:  Temperature 98.6, blood pressure  102/68, pulse 61, respirations 16, O2 saturation 95% on room air.  GENERAL:  This is a healthy appearing man, supine in the hospital bed in  no distress.  HEAD:  Cranium normocephalic and atraumatic.  Oropharynx is benign.  NECK:  Supple without carotid bruits.  CHEST:  A few rhonchi and wheezes throughout.  HEART:  Regular rate and rhythm without murmurs.  ABDOMEN:   Soft, normoactive bowel sounds, no hepatomegaly.  EXTREMITIES: No edema.  2+ pulses.  NEUROLOGIC:  Mental status:  He is awake and alert.  He is fully  oriented to time and place.  Recent and remote memory are intact.  He is  able to name objects and repeat phrases.  He appears a little bit  anxious.  Cranial nerves:  Funduscopic exam is benign.  Pupils are equal  and briskly reactive.  Extraocular movements are full without nystagmus.  Visual fields are full to confrontation.  Hearing is intact to  conversational speech.  The patient's sensation is intact to pin prick.  The patient's palate moves normally and symmetrically.  Motor:  Normal  bulk and tone.  Normal strength.  Normal intact extremity muscles.  Sensation intact to pin prick, light touch and proprioception in all  extremities.  Coordination:  Finger to nose performed accurately.  Heel  to shin is performed accurately.  Gait:  He arises easily from the bed  and his stance is normal.  He is able to heel toe and tandem walk  without difficulty.  Reflexes brisk throughout.  Toes are downgoing  bilaterally.   LABORATORY REVIEW:  B MET is remarkable for elevated creatinine of 2.2.  No other labs have been obtained at this time.  MRI of the brain is  reviewed.  This does, in fact, demonstrate a very small possible acute  stroke with effusion positivity in the area of the right external  capsule.  Previous pineal area mass is noted and has not changed in  size.  There is a hydrocephalus and shunts are in place.   IMPRESSION:  1. Tiny right brain stroke in a patient with risk factors including      hypertension and tobacco abuse.  2. Worsening renal insufficiency over baseline possibly secondary to      dehydration.  3. Nausea, vomiting and dizziness of uncertain etiology.   PLAN:  We will admit to stroke service for brief stroke work up  including carotid true scale Dopplers, 2D echocardiogram, MRA and stroke  labs.  He will be  started on an aspirin a day and advised to quit  smoking.  We will place him on a nicotine patch to help with his smoking  habit.  Stroke  service to follow.      Michael L. Thad Ranger, M.D.  Electronically Signed     MLR/MEDQ  D:  10/27/2006  T:  10/28/2006  Job:  [id number]

## 2010-07-22 NOTE — Discharge Summary (Signed)
[name], [name]               ACCOUNT NO.:  [id number]   MEDICAL RECORD NO.:  [id number]          PATIENT TYPE:  INP   LOCATION:  3014                         FACILITY:  [facility]   PHYSICIAN:  Danae Orleans. Venetia Maxon, M.D.  DATE OF BIRTH:  [birth year]   DATE OF ADMISSION:  06/28/2008  DATE OF DISCHARGE:  06/30/2008                               DISCHARGE SUMMARY   REASON FOR ADMISSION:  Pineal region cyst with tumor and obstructive  hydrocephalus, initially long-term use of aspirin.   HISTORY OF ILLNESS AND HOSPITAL COURSE:  Zachary Fields is a 65 year old  man who has had ventriculoperitoneal and cystoperitoneal shunt for long-  term management of obstructive hydrocephalus, which was initially  treated in 1975 for a pineal region tumor which was creating a large  cyst.  The patient had a wide shunt which was wide together and this was  last revised in 2008.  He then came in on February 16, 2009, after which  an MRI of his brain from February 17, 2008, which demonstrated a  loculated cyst of his pineal mass indenting the medial thalamus  measuring 11 x 17 mm and the shunt appeared to be intact and  functioning.  It was elected to take him to surgery and to  stereotactically aspirate the cyst and put in the brand new  cystoperitoneal shunt which was independent from the system.  The  patient underwent uncomplicated surgery using stealth neuronavigation  system and had significant improvement in his vision and after this he  has had significant problem secondary to tectal plate compression with  upgaze and accommodation was impaired.  He also complained of diplopia  and this resolved.  The patient was doing well on the 24th and was  discharged home at that time with instructions to follow up with me in  the office in 10 days for suture removal.      Danae Orleans. Venetia Maxon, M.D.  Electronically Signed     JDS/MEDQ  D:  08/14/2008  T:  08/14/2008  Job:  [id number]

## 2010-07-26 ENCOUNTER — Other Ambulatory Visit: Payer: Self-pay | Admitting: Internal Medicine

## 2010-08-15 ENCOUNTER — Ambulatory Visit: Payer: BC Managed Care – PPO

## 2010-08-16 ENCOUNTER — Other Ambulatory Visit: Payer: Self-pay | Admitting: Internal Medicine

## 2010-08-20 ENCOUNTER — Ambulatory Visit (INDEPENDENT_AMBULATORY_CARE_PROVIDER_SITE_OTHER): Payer: BC Managed Care – PPO | Admitting: *Deleted

## 2010-08-20 DIAGNOSIS — E538 Deficiency of other specified B group vitamins: Secondary | ICD-10-CM

## 2010-08-20 MED ORDER — CYANOCOBALAMIN 1000 MCG/ML IJ SOLN
1000.0000 ug | Freq: Once | INTRAMUSCULAR | Status: AC
  Administered 2010-08-20: 1000 ug via INTRAMUSCULAR

## 2010-09-19 ENCOUNTER — Ambulatory Visit (INDEPENDENT_AMBULATORY_CARE_PROVIDER_SITE_OTHER): Payer: BC Managed Care – PPO | Admitting: *Deleted

## 2010-09-19 DIAGNOSIS — E538 Deficiency of other specified B group vitamins: Secondary | ICD-10-CM

## 2010-09-19 MED ORDER — CYANOCOBALAMIN 1000 MCG/ML IJ SOLN
1000.0000 ug | Freq: Once | INTRAMUSCULAR | Status: AC
  Administered 2010-09-19: 1000 ug via INTRAMUSCULAR

## 2010-10-17 ENCOUNTER — Ambulatory Visit: Payer: BC Managed Care – PPO

## 2010-10-18 ENCOUNTER — Other Ambulatory Visit: Payer: Self-pay | Admitting: Internal Medicine

## 2010-10-20 ENCOUNTER — Ambulatory Visit: Payer: BC Managed Care – PPO

## 2010-10-20 ENCOUNTER — Ambulatory Visit (INDEPENDENT_AMBULATORY_CARE_PROVIDER_SITE_OTHER): Payer: BC Managed Care – PPO | Admitting: *Deleted

## 2010-10-20 DIAGNOSIS — E538 Deficiency of other specified B group vitamins: Secondary | ICD-10-CM

## 2010-10-20 MED ORDER — CYANOCOBALAMIN 1000 MCG/ML IJ SOLN
1000.0000 ug | Freq: Once | INTRAMUSCULAR | Status: AC
  Administered 2010-10-20: 1000 ug via INTRAMUSCULAR

## 2010-10-20 NOTE — Telephone Encounter (Signed)
Rx Done . 

## 2010-11-01 ENCOUNTER — Other Ambulatory Visit: Payer: Self-pay | Admitting: Internal Medicine

## 2010-11-21 ENCOUNTER — Ambulatory Visit (INDEPENDENT_AMBULATORY_CARE_PROVIDER_SITE_OTHER): Payer: BC Managed Care – PPO

## 2010-11-21 ENCOUNTER — Ambulatory Visit: Payer: BC Managed Care – PPO

## 2010-11-21 DIAGNOSIS — D519 Vitamin B12 deficiency anemia, unspecified: Secondary | ICD-10-CM

## 2010-11-21 DIAGNOSIS — D518 Other vitamin B12 deficiency anemias: Secondary | ICD-10-CM

## 2010-11-21 MED ORDER — CYANOCOBALAMIN 1000 MCG/ML IJ SOLN
1000.0000 ug | Freq: Once | INTRAMUSCULAR | Status: AC
  Administered 2010-11-21: 1000 ug via INTRAMUSCULAR

## 2010-12-12 ENCOUNTER — Encounter: Payer: Self-pay | Admitting: Internal Medicine

## 2010-12-12 ENCOUNTER — Ambulatory Visit (INDEPENDENT_AMBULATORY_CARE_PROVIDER_SITE_OTHER): Payer: BC Managed Care – PPO | Admitting: Internal Medicine

## 2010-12-12 DIAGNOSIS — D508 Other iron deficiency anemias: Secondary | ICD-10-CM

## 2010-12-12 DIAGNOSIS — J449 Chronic obstructive pulmonary disease, unspecified: Secondary | ICD-10-CM

## 2010-12-12 DIAGNOSIS — Z0000 Encounter for general adult medical examination without abnormal findings: Secondary | ICD-10-CM

## 2010-12-12 DIAGNOSIS — N400 Enlarged prostate without lower urinary tract symptoms: Secondary | ICD-10-CM

## 2010-12-12 DIAGNOSIS — I10 Essential (primary) hypertension: Secondary | ICD-10-CM

## 2010-12-12 DIAGNOSIS — M549 Dorsalgia, unspecified: Secondary | ICD-10-CM

## 2010-12-12 DIAGNOSIS — Z23 Encounter for immunization: Secondary | ICD-10-CM

## 2010-12-12 HISTORY — DX: Benign prostatic hyperplasia without lower urinary tract symptoms: N40.0

## 2010-12-12 MED ORDER — TAMSULOSIN HCL 0.4 MG PO CAPS: 0.4000 mg | ORAL_CAPSULE | ORAL | Status: DC

## 2010-12-12 MED ORDER — CYANOCOBALAMIN 1000 MCG/ML IJ SOLN
1000.0000 ug | Freq: Once | INTRAMUSCULAR | Status: AC
  Administered 2010-12-12: 1000 ug via INTRAMUSCULAR

## 2010-12-12 MED ORDER — TIOTROPIUM BROMIDE MONOHYDRATE 18 MCG IN CAPS: 18.0000 ug | ORAL_CAPSULE | Freq: Every day | RESPIRATORY_TRACT | Status: DC

## 2010-12-12 NOTE — Assessment & Plan Note (Addendum)
Td 07, flu shot today, shingles shot discussed, likes to proceed tobacco : still smoking----> strongly counseled, rec to see a dentist Colonoscopy 09-2009, repeat in 3 years--->  Dr. Christella Hartigan Rectal tag? Polyp? on DRE? Refer back to GI  diet and exercise Pt authorized me to talk with daughter [name] who is a nurse in the system  Other relevant tests: 4/7: neg stress test 9/7: nl ABIs 2/8: neg Carotid u/s

## 2010-12-12 NOTE — Patient Instructions (Signed)
Chest XR  flomax  spiriva Came back fasting: CMP CBC TSH FLP----dx V70 PSA UA UCX--- dx BPH

## 2010-12-12 NOTE — Progress Notes (Signed)
Subjective:    Patient ID: Zachary Fields, male    DOB: 19-Jul-1945, 65 y.o.   MRN: [id number]  HPI CPX Also 4 months h/o severe L sided low back pain w/  Radiation to the L side of the leg up to the knee; no b/b incontinence , no fever, no injury. Pain initially intense , now improved   Past Medical History  Diagnosis Date  . Hypertension   . Anemia   . 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 CRI (chronic renal insufficiency)   . Depression   . Hyperhomocysteinemia   . 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 Venetia Maxon in 1997. Shunt replaced 06-2008  . Dizziness     Chronic paresthesias and dizziness not related with stroke  . Renal cyst     08-2009 Renal cysts( R ) at outside hospital (seen also in a u/s 2007), [facility] for f/u u/s 11-11  . Deaf     L ear  . 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 Laparotomy     s/p remobal of swallowed of a FB from the stomach when he was 65 y/o      History   Social History  . Marital Status: Divorced    Spouse Name: N/A    Number of Children: 2  . Years of Education: N/A   Occupational History  . accountant    Social History Main Topics  . Smoking status: Current Everyday Smoker -- 1.5 packs/day    Types: Cigarettes  . Smokeless tobacco: Never Used  . Alcohol Use: Yes     soacially   . Drug Use: No  . Sexually Active: Not on file   Other Topics Concern  . Not on file   Social History Narrative   Works in [place], 2 daughters ([name]), lives w/  Cynthia---diet:not healthy, eats little -- no exercise     Family History  Problem Relation Age of Onset  . Colon cancer Mother     colon  . Cancer Brother     bladder  . Prostate cancer Neg Hx   . Diabetes Neg Hx   . Coronary artery disease      Uncle MI at age 52s  . Stroke Neg Hx       Review of Systems  Respiratory: Positive for  wheezing. Cough: "smokers cough", occ sputum, no hemoptysis.   Gastrointestinal: Negative for abdominal pain and blood in stool.  Genitourinary: Negative for dysuria.       Chronic difficulty urinating ("stop and go") , nocturia.  Neurological:       Ongoing dizziness and tinnitus  Psychiatric/Behavioral:       Nbo anxiety-depression       Objective:   Physical Exam  Constitutional: He is oriented to person, place, and time. He appears well-developed and well-nourished.  HENT:  Head: Normocephalic.  Neck: No thyromegaly present.       Shunt noted at L neck  Cardiovascular: Normal rate, regular rhythm and normal heart sounds.   No murmur heard. Pulmonary/Chest:       No increased WOB, few end exp wheezes, + ronchi w/  cough  Abdominal: Soft. He exhibits no distension. There is no tenderness. There is no rebound and no guarding.  Genitourinary:  DRE: I felt a soft 3/4 cm, pedunculated  anal mass (tag? Polyp?). Prostate no enlarged, no nodular , slt tender   Musculoskeletal: Edema: trace B edema.  Lymphadenopathy:    He has no cervical adenopathy.  Neurological: He is alert and oriented to person, place, and time.       Gait and posture normal, motor and LE DTRs normal       Assessment & Plan:   In addition of CPX we spent in excess of 20 min with the patient due to a number of other issues, see below. Back pain: Back pain w/  Radicular features better, normal neuro exam, rec observation

## 2010-12-12 NOTE — Assessment & Plan Note (Signed)
Has chronic BPH type of sx , prostate not particularly enlarged ; reports he has seen urology before (no records) Plan: UA UCX PSA flomax trial

## 2010-12-12 NOTE — Assessment & Plan Note (Signed)
Base on tobacco history and chronic cough, he has COPD counsled CXR  As long as he smokes will have some cough Trial w/  spiriva  Recheck in 2 moinths Will need PFTs at some point

## 2010-12-13 NOTE — Assessment & Plan Note (Signed)
At goal.  

## 2010-12-16 ENCOUNTER — Encounter: Payer: Self-pay | Admitting: Internal Medicine

## 2010-12-17 ENCOUNTER — Telehealth: Payer: Self-pay | Admitting: Gastroenterology

## 2010-12-17 ENCOUNTER — Telehealth: Payer: Self-pay | Admitting: Internal Medicine

## 2010-12-17 NOTE — Telephone Encounter (Signed)
Spoke with [name], discussed  recent physical exam of Charlies, her father.

## 2010-12-17 NOTE — Telephone Encounter (Signed)
In reference to Gastroenterology Referral, per Dr. Christella Hartigan office, patient will be due for his next Colon in 09/2012, he will be mailed a letter to call and schedule at that time.  Patient is aware.

## 2010-12-17 NOTE — Telephone Encounter (Signed)
Message copied by Wanda Plump on Wed Dec 17, 2010 11:08 AM ------      Message from: Wanda Plump      Created: Sat Dec 13, 2010  3:10 PM       Call [name]

## 2010-12-18 ENCOUNTER — Other Ambulatory Visit: Payer: Self-pay | Admitting: Internal Medicine

## 2010-12-18 DIAGNOSIS — Z0000 Encounter for general adult medical examination without abnormal findings: Secondary | ICD-10-CM

## 2010-12-18 NOTE — Telephone Encounter (Signed)
Patient scheduled to see Dr. Sonda Primes & patient is aware.

## 2010-12-18 NOTE — Telephone Encounter (Signed)
Had Colon on 09/2009, polyps found.  Dr Drue Novel found rectal polyp or skin tag on exam 12/12/10 per Dr Drue Novel request appt   Scheduled for 01/09/11 pt daughter is aware

## 2010-12-18 NOTE — Telephone Encounter (Signed)
Reason for referral is that I felt a rectal polyp or skin tag , I am not requesting a screening colonoscopy, he actually may need a sigmoidoscopy. Please arrange a visit with GI

## 2010-12-19 ENCOUNTER — Other Ambulatory Visit (INDEPENDENT_AMBULATORY_CARE_PROVIDER_SITE_OTHER): Payer: BC Managed Care – PPO

## 2010-12-19 ENCOUNTER — Ambulatory Visit: Payer: BC Managed Care – PPO

## 2010-12-19 DIAGNOSIS — Z0000 Encounter for general adult medical examination without abnormal findings: Secondary | ICD-10-CM

## 2010-12-19 LAB — LIPID PANEL
HDL: 57.3 mg/dL (ref >39.00)
LDL Cholesterol: 38
Triglycerides: 25 mg/dL (ref 0.0–149.0)
VLDL: 5 mg/dL (ref 0.0–40.0)
VLDL: 14

## 2010-12-19 LAB — I-STAT 8, (EC8 V) (CONVERTED LAB)
BUN: 15
Bicarbonate: 26.6 — ABNORMAL HIGH
HCT: 46
Hemoglobin: 15.6
Operator id: 294521
Sodium: 138
TCO2: 28

## 2010-12-19 LAB — COMPREHENSIVE METABOLIC PANEL
ALT: 11 U/L (ref 0–53)
ALT: 14
AST: 15
CO2: 29 mEq/L (ref 19–32)
Calcium: 8.9 mg/dL (ref 8.4–10.5)
Calcium: 9.2
Chloride: 103 mEq/L (ref 96–112)
Creatinine, Ser: 2.1 mg/dL — ABNORMAL HIGH (ref 0.4–1.5)
Creatinine, Ser: 2.02 — ABNORMAL HIGH
GFR calc Af Amer: 41 — ABNORMAL LOW
GFR: 33.15 mL/min — ABNORMAL LOW (ref >60.00)
Glucose, Bld: 76 mg/dL (ref 70–99)
Glucose, Bld: 92
Sodium: 140
Total Bilirubin: 0.9 mg/dL (ref 0.3–1.2)
Total Protein: 6.7 g/dL (ref 6.0–8.3)
Total Protein: 6.3

## 2010-12-19 LAB — URINALYSIS, ROUTINE W REFLEX MICROSCOPIC
Bilirubin Urine: NEGATIVE
Hgb urine dipstick: NEGATIVE
Ketones, ur: NEGATIVE
Protein, ur: NEGATIVE
Urobilinogen, UA: 0.2

## 2010-12-19 LAB — PROTIME-INR
INR: 0.9
Prothrombin Time: 12.1

## 2010-12-19 LAB — POCT URINALYSIS DIPSTICK
Bilirubin, UA: NEGATIVE
Glucose, UA: NEGATIVE
Ketones, UA: NEGATIVE
Spec Grav, UA: 1.005

## 2010-12-19 LAB — CBC WITH DIFFERENTIAL/PLATELET
Basophils Absolute: 0 10*3/uL (ref 0.0–0.1)
Eosinophils Relative: 1.1 % (ref 0.0–5.0)
Hemoglobin: 14.1 g/dL (ref 13.0–17.0)
Lymphocytes Relative: 22.8 % (ref 12.0–46.0)
Monocytes Relative: 5.5 % (ref 3.0–12.0)
Platelets: 242 10*3/uL (ref 150.0–400.0)
RDW: 15.3 % — ABNORMAL HIGH (ref 11.5–14.6)
WBC: 7 10*3/uL (ref 4.5–10.5)

## 2010-12-19 LAB — CK TOTAL AND CKMB (NOT AT ARMC)
Relative Index: INVALID
Total CK: 72

## 2010-12-19 LAB — RAPID URINE DRUG SCREEN, HOSP PERFORMED
Amphetamines: NOT DETECTED
Tetrahydrocannabinol: NOT DETECTED

## 2010-12-19 LAB — POCT I-STAT CREATININE
Creatinine, Ser: 2.2 — ABNORMAL HIGH
Operator id: 294521

## 2010-12-19 LAB — CBC
RBC: 4.91
WBC: 8.1

## 2010-12-19 LAB — TSH: TSH: 0.98 u[IU]/mL (ref 0.35–5.50)

## 2010-12-19 LAB — APTT: aPTT: 27

## 2010-12-19 LAB — HEMOGLOBIN A1C: Hgb A1c MFr Bld: 5.4

## 2010-12-19 NOTE — Progress Notes (Signed)
Labs only

## 2010-12-22 LAB — CULTURE, URINE COMPREHENSIVE: Colony Count: 50000

## 2010-12-25 ENCOUNTER — Telehealth: Payer: Self-pay | Admitting: *Deleted

## 2010-12-25 DIAGNOSIS — N390 Urinary tract infection, site not specified: Secondary | ICD-10-CM

## 2010-12-25 MED ORDER — LEVOFLOXACIN 250 MG PO TABS: ORAL_TABLET | ORAL | Status: AC

## 2010-12-25 NOTE — Telephone Encounter (Signed)
Patient informed. Rx sent to pharmacy. Lab Urine Culture order placed.

## 2010-12-25 NOTE — Telephone Encounter (Signed)
Message copied by Regis Bill on Thu Dec 25, 2010  5:06 PM ------      Message from: Willow Ora E      Created: Tue Dec 23, 2010  6:11 PM       Advise patient      Labs are okay except for a urinary tract infection      Levaquin 250 mg 2 tablets the first day then one tablet daily for 10 days ; drink plenty of fluids.      Please arrange a urine culture in a month, DX UTI

## 2011-01-02 ENCOUNTER — Ambulatory Visit (INDEPENDENT_AMBULATORY_CARE_PROVIDER_SITE_OTHER)
Admission: RE | Admit: 2011-01-02 | Discharge: 2011-01-02 | Disposition: A | Discharge status: Home / Self Care | Payer: BC Managed Care – PPO | Source: Ambulatory Visit | Attending: Internal Medicine | Admitting: Internal Medicine

## 2011-01-02 DIAGNOSIS — J449 Chronic obstructive pulmonary disease, unspecified: Secondary | ICD-10-CM

## 2011-01-05 ENCOUNTER — Telehealth: Payer: Self-pay

## 2011-01-05 NOTE — Telephone Encounter (Signed)
Pt aware.

## 2011-01-05 NOTE — Telephone Encounter (Signed)
Message copied by Beverely Low on Mon Jan 05, 2011  2:14 PM ------      Message from: Wanda Plump      Created: Mon Jan 05, 2011 10:38 AM       Advise patient, chest x-ray normal

## 2011-01-09 ENCOUNTER — Ambulatory Visit (INDEPENDENT_AMBULATORY_CARE_PROVIDER_SITE_OTHER): Payer: Self-pay | Admitting: Gastroenterology

## 2011-01-09 ENCOUNTER — Encounter: Payer: Self-pay | Admitting: Gastroenterology

## 2011-01-09 ENCOUNTER — Ambulatory Visit: Payer: BC Managed Care – PPO

## 2011-01-09 VITALS — BP 124/78 | HR 64 | Ht 68.0 in | Wt 173.0 lb

## 2011-01-09 DIAGNOSIS — K621 Rectal polyp: Secondary | ICD-10-CM

## 2011-01-09 DIAGNOSIS — R933 Abnormal findings on diagnostic imaging of other parts of digestive tract: Secondary | ICD-10-CM

## 2011-01-09 NOTE — Progress Notes (Signed)
Review of pertinent gastrointestinal problems: 1. personal history of adenomatous colon polyps. Colonoscopy June 2011 found left-sided diverticulosis, hemorrhoids, 4 polyps one of which was a 1.5 cm adenomatous polyp. He was recommended to have repeat colonoscopy at 3 year interval. 2. mother had colon cancer.  HPI: This is a  very pleasant 65 year old man whom I last saw at the time of a colonoscopy about a year and a half ago. See those results summarized above  He was recently in for routine general medical physical. His primary care physician was examining his prostate and felt a pedunculated lesion in his anus. He sent him here for further evaluation  No anal pain discomfort, no bleeding.        Past Medical History  Diagnosis Date  . Hypertension   . Anemia   . 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 CRI (chronic renal insufficiency)   . Depression   . Hyperhomocysteinemia   . 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 Venetia Maxon in 1997. Shunt replaced 06-2008  . Dizziness     Chronic paresthesias and dizziness not related with stroke  . Renal cyst     08-2009 Renal cysts( R ) at outside hospital (seen also in a u/s 2007[facility]  . Deaf     L ear  . 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 Diverticulosis   . Colon polyps   . Internal hemorrhoids   . External hemorrhoids     Past Surgical History  Procedure Date  . Laparotomy     s/p remobal of swallowed of a FB from the stomach when he was 65 y/o      Current Outpatient Prescriptions  Medication Sig Dispense Refill  . aspirin 325 MG tablet Take 325 mg by mouth daily.        . CYANOCOBALAMIN IJ Inject 1,000 mcg as directed every 30 (thirty) days.       . felodipine (PLENDIL) 10 MG 24 hr tablet TAKE ONE TABLET BY MOUTH EVERY DAY  30 tablet  2  . furosemide (LASIX) 40 MG tablet TAKE ONE TABLET BY MOUTH EVERY DAY  30  tablet  3  . Tamsulosin HCl (FLOMAX) 0.4 MG CAPS Take 1 capsule (0.4 mg total) by mouth daily after supper.  30 capsule  6    Allergies as of 01/09/2011 - Review Complete 01/09/2011  Allergen Reaction Noted  . Varenicline tartrate  01/22/2009    Family History  Problem Relation Age of Onset  . Colon cancer Mother     colon  . Cancer Brother     bladder  . Prostate cancer Neg Hx   . Diabetes Neg Hx   . Coronary artery disease      Uncle MI at age 53s  . Stroke Neg Hx     History   Social History  . Marital Status: Divorced    Spouse Name: N/A    Number of Children: 2  . Years of Education: N/A   Occupational History  . accountant    Social History Main Topics  . Smoking status: Current Everyday Smoker -- 1.5 packs/day    Types: Cigarettes  . Smokeless tobacco: Never Used   Comment: Sheet given in exam room for counseling to quit smoking   . Alcohol Use:  Yes     social   . Drug Use: No  . Sexually Active: Not on file   Other Topics Concern  . Not on file   Social History Narrative   [place] in [place], 2 daughters ([name]), lives w/  Cynthia---diet:not healthy, eats little -- no exercise        Physical Exam: BP 124/78  Pulse 64  Ht 5\' 8"  (1.727 m)  Wt 173 lb (78.472 kg)  BMI 26.30 kg/m2 Constitutional: generally well-appearing Psychiatric: alert and oriented x3 Abdomen: soft, nontender, nondistended, no obvious ascites, no peritoneal signs, normal bowel sounds Rectal examination: Small external anal hemorrhoids but no internal anal disease, there was brown stool in his rectum, I felt no distal rectal lesions.    Assessment and plan: 65 y.o. male with anal, distal rectal lesion on recent physical exam  I do not feel anything in his anus, distal rectum but his primary care physician did. I think to know for certain that we will proceed with a flexible sigmoidoscopy at his soonest convenience.  His mother had colon cancer and he himself had a 1.5  cm adenoma a year and half ago.

## 2011-01-09 NOTE — Patient Instructions (Addendum)
One of your biggest health concerns is your smoking.  This increases your risk for most cancers and serious cardiovascular diseases such as strokes, heart attacks.  You should try your best to stop.  If you need assistance, please contact your PCP or Smoking Cessation Class at [facility] [phone]) or [facility] Quit-Line (1-800-QUIT-NOW). You will be set up for a flexible sigmoidoscopy.

## 2011-01-12 ENCOUNTER — Ambulatory Visit (INDEPENDENT_AMBULATORY_CARE_PROVIDER_SITE_OTHER): Payer: Self-pay | Admitting: Internal Medicine

## 2011-01-12 DIAGNOSIS — D518 Other vitamin B12 deficiency anemias: Secondary | ICD-10-CM

## 2011-01-12 MED ORDER — CYANOCOBALAMIN 1000 MCG/ML IJ SOLN
1000.0000 ug | Freq: Once | INTRAMUSCULAR | Status: AC
  Administered 2011-01-12: 1000 ug via INTRAMUSCULAR

## 2011-01-12 NOTE — Progress Notes (Signed)
  Subjective:    Patient ID: Zachary Fields, male    DOB: 06/20/1945, 65 y.o.   MRN: [id number]  HPI  Patient present for B-12 injection Review of Systems     Objective:   Physical Exam        Assessment & Plan:  Follow up in one month for next injection

## 2011-01-16 ENCOUNTER — Other Ambulatory Visit: Payer: Self-pay | Admitting: Gastroenterology

## 2011-02-02 ENCOUNTER — Telehealth: Payer: Self-pay | Admitting: *Deleted

## 2011-02-02 NOTE — Telephone Encounter (Signed)
Pt daughter left VM that Pt has lost his insurance coverage for now however he will be going on medicare in January. Pt is unable to afford med without insurance. Pt daughter is requesting that Pt med be change to one of the med on $4 list at [facility]..Please advise

## 2011-02-05 NOTE — Telephone Encounter (Signed)
All meds he is on are generic, very close to $4; we could send new prescriptions to [facility] or [facility] and see if he can afford

## 2011-02-05 NOTE — Telephone Encounter (Signed)
Discuss with patient daughter 

## 2011-02-05 NOTE — Telephone Encounter (Signed)
Left message to call office

## 2011-02-13 ENCOUNTER — Ambulatory Visit (INDEPENDENT_AMBULATORY_CARE_PROVIDER_SITE_OTHER): Payer: PRIVATE HEALTH INSURANCE | Admitting: *Deleted

## 2011-02-13 ENCOUNTER — Ambulatory Visit: Payer: Self-pay

## 2011-02-13 DIAGNOSIS — E538 Deficiency of other specified B group vitamins: Secondary | ICD-10-CM

## 2011-02-13 MED ORDER — CYANOCOBALAMIN 1000 MCG/ML IJ SOLN
1000.0000 ug | Freq: Once | INTRAMUSCULAR | Status: AC
  Administered 2011-02-13: 1000 ug via INTRAMUSCULAR

## 2011-02-21 ENCOUNTER — Other Ambulatory Visit: Payer: Self-pay | Admitting: Internal Medicine

## 2011-03-20 ENCOUNTER — Ambulatory Visit: Payer: PRIVATE HEALTH INSURANCE

## 2011-03-20 ENCOUNTER — Other Ambulatory Visit: Payer: Self-pay | Admitting: Internal Medicine

## 2011-03-24 ENCOUNTER — Ambulatory Visit (INDEPENDENT_AMBULATORY_CARE_PROVIDER_SITE_OTHER): Payer: Medicare Other | Admitting: *Deleted

## 2011-03-24 DIAGNOSIS — E538 Deficiency of other specified B group vitamins: Secondary | ICD-10-CM

## 2011-03-24 MED ORDER — CYANOCOBALAMIN 1000 MCG/ML IJ SOLN
1000.0000 ug | Freq: Once | INTRAMUSCULAR | Status: AC
  Administered 2011-03-24: 1000 ug via INTRAMUSCULAR

## 2011-04-24 ENCOUNTER — Ambulatory Visit: Payer: Medicare Other

## 2011-04-28 ENCOUNTER — Ambulatory Visit (INDEPENDENT_AMBULATORY_CARE_PROVIDER_SITE_OTHER): Payer: Medicare Other | Admitting: *Deleted

## 2011-04-28 ENCOUNTER — Telehealth: Payer: Self-pay | Admitting: *Deleted

## 2011-04-28 DIAGNOSIS — E538 Deficiency of other specified B group vitamins: Secondary | ICD-10-CM

## 2011-04-28 MED ORDER — CYANOCOBALAMIN 1000 MCG/ML IJ SOLN
1000.0000 ug | Freq: Once | INTRAMUSCULAR | Status: AC
  Administered 2011-04-28: 1000 ug via INTRAMUSCULAR

## 2011-04-28 MED ORDER — CYANOCOBALAMIN 1000 MCG/ML IJ SOLN: 1000.0000 ug | INTRAMUSCULAR | Status: DC

## 2011-04-28 NOTE — Telephone Encounter (Signed)
Does pt need to have his B-12 checked?

## 2011-04-28 NOTE — Telephone Encounter (Signed)
Pt would like to know if it would be ok for his daughter Arline Asp to give him his B12 shots at home instead of him coming in to office for injection. Pt note that she is a CMA. Pt will need Rx sent in to pharmacy if it is ok.Please advise

## 2011-04-28 NOTE — Telephone Encounter (Signed)
That is ok 

## 2011-04-29 NOTE — Telephone Encounter (Signed)
As long as he takes the shot, not more often than once a year. Next check when he comes back for his routine visit.

## 2011-04-29 NOTE — Telephone Encounter (Signed)
OK 

## 2011-05-29 ENCOUNTER — Ambulatory Visit (INDEPENDENT_AMBULATORY_CARE_PROVIDER_SITE_OTHER): Payer: Medicare Other | Admitting: *Deleted

## 2011-05-29 DIAGNOSIS — D508 Other iron deficiency anemias: Secondary | ICD-10-CM

## 2011-05-29 MED ORDER — CYANOCOBALAMIN 1000 MCG/ML IJ SOLN
1000.0000 ug | Freq: Once | INTRAMUSCULAR | Status: AC
  Administered 2011-05-29: 1000 ug via INTRAMUSCULAR

## 2011-06-26 ENCOUNTER — Ambulatory Visit: Payer: Medicare Other

## 2011-07-03 ENCOUNTER — Emergency Department (INDEPENDENT_AMBULATORY_CARE_PROVIDER_SITE_OTHER): Payer: Medicare Other

## 2011-07-03 ENCOUNTER — Ambulatory Visit (INDEPENDENT_AMBULATORY_CARE_PROVIDER_SITE_OTHER): Payer: Medicare Other | Admitting: *Deleted

## 2011-07-03 ENCOUNTER — Inpatient Hospital Stay (HOSPITAL_BASED_OUTPATIENT_CLINIC_OR_DEPARTMENT_OTHER)
Admission: EM | Admit: 2011-07-03 | Discharge: 2011-07-06 | DRG: 194 | Disposition: A | Discharge status: Home / Self Care | Payer: Medicare Other | Attending: Internal Medicine | Admitting: Internal Medicine

## 2011-07-03 ENCOUNTER — Telehealth: Payer: Self-pay | Admitting: *Deleted

## 2011-07-03 ENCOUNTER — Encounter (HOSPITAL_BASED_OUTPATIENT_CLINIC_OR_DEPARTMENT_OTHER): Payer: Self-pay | Admitting: *Deleted

## 2011-07-03 DIAGNOSIS — N179 Acute kidney failure, unspecified: Secondary | ICD-10-CM | POA: Diagnosis present

## 2011-07-03 DIAGNOSIS — R1110 Vomiting, unspecified: Secondary | ICD-10-CM

## 2011-07-03 DIAGNOSIS — D509 Iron deficiency anemia, unspecified: Secondary | ICD-10-CM | POA: Diagnosis present

## 2011-07-03 DIAGNOSIS — Z982 Presence of cerebrospinal fluid drainage device: Secondary | ICD-10-CM

## 2011-07-03 DIAGNOSIS — R4182 Altered mental status, unspecified: Secondary | ICD-10-CM

## 2011-07-03 DIAGNOSIS — R413 Other amnesia: Secondary | ICD-10-CM

## 2011-07-03 DIAGNOSIS — F3289 Other specified depressive episodes: Secondary | ICD-10-CM | POA: Diagnosis present

## 2011-07-03 DIAGNOSIS — J4489 Other specified chronic obstructive pulmonary disease: Secondary | ICD-10-CM | POA: Diagnosis present

## 2011-07-03 DIAGNOSIS — E538 Deficiency of other specified B group vitamins: Secondary | ICD-10-CM

## 2011-07-03 DIAGNOSIS — F329 Major depressive disorder, single episode, unspecified: Secondary | ICD-10-CM | POA: Diagnosis present

## 2011-07-03 DIAGNOSIS — G9389 Other specified disorders of brain: Secondary | ICD-10-CM

## 2011-07-03 DIAGNOSIS — I10 Essential (primary) hypertension: Secondary | ICD-10-CM

## 2011-07-03 DIAGNOSIS — F17200 Nicotine dependence, unspecified, uncomplicated: Secondary | ICD-10-CM

## 2011-07-03 DIAGNOSIS — D369 Benign neoplasm, unspecified site: Secondary | ICD-10-CM

## 2011-07-03 DIAGNOSIS — H9190 Unspecified hearing loss, unspecified ear: Secondary | ICD-10-CM

## 2011-07-03 DIAGNOSIS — Z66 Do not resuscitate: Secondary | ICD-10-CM | POA: Diagnosis present

## 2011-07-03 DIAGNOSIS — R001 Bradycardia, unspecified: Secondary | ICD-10-CM

## 2011-07-03 DIAGNOSIS — J449 Chronic obstructive pulmonary disease, unspecified: Secondary | ICD-10-CM

## 2011-07-03 DIAGNOSIS — J189 Pneumonia, unspecified organism: Principal | ICD-10-CM

## 2011-07-03 DIAGNOSIS — Z8673 Personal history of transient ischemic attack (TIA), and cerebral infarction without residual deficits: Secondary | ICD-10-CM

## 2011-07-03 DIAGNOSIS — E7210 Disorders of sulfur-bearing amino-acid metabolism, unspecified: Secondary | ICD-10-CM

## 2011-07-03 DIAGNOSIS — D508 Other iron deficiency anemias: Secondary | ICD-10-CM

## 2011-07-03 DIAGNOSIS — I129 Hypertensive chronic kidney disease with stage 1 through stage 4 chronic kidney disease, or unspecified chronic kidney disease: Secondary | ICD-10-CM | POA: Diagnosis present

## 2011-07-03 DIAGNOSIS — R680 Hypothermia, not associated with low environmental temperature: Secondary | ICD-10-CM | POA: Diagnosis present

## 2011-07-03 DIAGNOSIS — Q619 Cystic kidney disease, unspecified: Secondary | ICD-10-CM

## 2011-07-03 DIAGNOSIS — N281 Cyst of kidney, acquired: Secondary | ICD-10-CM

## 2011-07-03 DIAGNOSIS — N183 Chronic kidney disease, stage 3 unspecified: Secondary | ICD-10-CM | POA: Diagnosis present

## 2011-07-03 DIAGNOSIS — R42 Dizziness and giddiness: Secondary | ICD-10-CM

## 2011-07-03 DIAGNOSIS — N259 Disorder resulting from impaired renal tubular function, unspecified: Secondary | ICD-10-CM

## 2011-07-03 DIAGNOSIS — N400 Enlarged prostate without lower urinary tract symptoms: Secondary | ICD-10-CM

## 2011-07-03 DIAGNOSIS — R918 Other nonspecific abnormal finding of lung field: Secondary | ICD-10-CM

## 2011-07-03 DIAGNOSIS — E876 Hypokalemia: Secondary | ICD-10-CM

## 2011-07-03 DIAGNOSIS — H9319 Tinnitus, unspecified ear: Secondary | ICD-10-CM

## 2011-07-03 DIAGNOSIS — Z7982 Long term (current) use of aspirin: Secondary | ICD-10-CM

## 2011-07-03 DIAGNOSIS — I498 Other specified cardiac arrhythmias: Secondary | ICD-10-CM | POA: Diagnosis present

## 2011-07-03 DIAGNOSIS — A088 Other specified intestinal infections: Secondary | ICD-10-CM | POA: Diagnosis present

## 2011-07-03 DIAGNOSIS — D497 Neoplasm of unspecified behavior of endocrine glands and other parts of nervous system: Secondary | ICD-10-CM | POA: Diagnosis present

## 2011-07-03 LAB — URINALYSIS, ROUTINE W REFLEX MICROSCOPIC
Ketones, ur: NEGATIVE mg/dL
Leukocytes, UA: NEGATIVE
Nitrite: NEGATIVE
Protein, ur: 30 mg/dL — AB
Urobilinogen, UA: 1 mg/dL (ref 0.0–1.0)

## 2011-07-03 LAB — DIFFERENTIAL
Basophils Absolute: 0 10*3/uL (ref 0.0–0.1)
Basophils Relative: 0 % (ref 0–1)
Eosinophils Relative: 2 % (ref 0–5)
Lymphocytes Relative: 28 % (ref 12–46)

## 2011-07-03 LAB — AMMONIA: Ammonia: 22 umol/L (ref 11–60)

## 2011-07-03 LAB — TROPONIN I: Troponin I: <0.3 ng/mL (ref <0.30)

## 2011-07-03 LAB — LACTIC ACID, PLASMA: Lactic Acid, Venous: 1.1 mmol/L (ref 0.5–2.2)

## 2011-07-03 LAB — COMPREHENSIVE METABOLIC PANEL
ALT: 9 U/L (ref 0–53)
AST: 12 U/L (ref 0–37)
Albumin: 4 g/dL (ref 3.5–5.2)
CO2: 27 mEq/L (ref 19–32)
Calcium: 8.9 mg/dL (ref 8.4–10.5)
GFR calc non Af Amer: 33 mL/min — ABNORMAL LOW (ref >90)
Sodium: 138 mEq/L (ref 135–145)
Total Protein: 6.9 g/dL (ref 6.0–8.3)

## 2011-07-03 LAB — CBC
MCHC: 33.9 g/dL (ref 30.0–36.0)
MCV: 84.5 fL (ref 78.0–100.0)
Platelets: 260 10*3/uL (ref 150–400)
RDW: 13.8 % (ref 11.5–15.5)
WBC: 10.7 10*3/uL — ABNORMAL HIGH (ref 4.0–10.5)

## 2011-07-03 LAB — PROTIME-INR: INR: 0.91 (ref 0.00–1.49)

## 2011-07-03 MED ORDER — CYANOCOBALAMIN 1000 MCG/ML IJ SOLN
1000.0000 ug | Freq: Once | INTRAMUSCULAR | Status: AC
  Administered 2011-07-03: 1000 ug via INTRAMUSCULAR

## 2011-07-03 MED ORDER — ONDANSETRON HCL 4 MG/2ML IJ SOLN
4.0000 mg | Freq: Once | INTRAMUSCULAR | Status: AC
  Administered 2011-07-03: 4 mg via INTRAVENOUS
  Filled 2011-07-03: qty 2

## 2011-07-03 MED ORDER — CYANOCOBALAMIN 1000 MCG/ML IJ SOLN: 1000.0000 ug | Freq: Once | INTRAMUSCULAR | Status: DC

## 2011-07-03 NOTE — Telephone Encounter (Signed)
Sent in rx.

## 2011-07-03 NOTE — Telephone Encounter (Signed)
Yes, call a rx if needed

## 2011-07-03 NOTE — Telephone Encounter (Signed)
Pt came into the office today to have his B-12 injection. He would like to know if it would be ok for his daughter who is a CMT to start giving him his B-12 injections at home. Please advise.

## 2011-07-03 NOTE — ED Notes (Signed)
Sudden onset of dizziness, nausea, severe headache, vomiting and lethargy. Hx of brain tumor with cerebral shunts. No verbal response at triage.

## 2011-07-04 ENCOUNTER — Encounter (HOSPITAL_COMMUNITY): Payer: Self-pay | Admitting: *Deleted

## 2011-07-04 DIAGNOSIS — J189 Pneumonia, unspecified organism: Secondary | ICD-10-CM

## 2011-07-04 DIAGNOSIS — E876 Hypokalemia: Secondary | ICD-10-CM

## 2011-07-04 DIAGNOSIS — R4182 Altered mental status, unspecified: Secondary | ICD-10-CM

## 2011-07-04 LAB — CBC
HCT: 32.7 % — ABNORMAL LOW (ref 39.0–52.0)
MCV: 84.7 fL (ref 78.0–100.0)
Platelets: 185 10*3/uL (ref 150–400)
RBC: 3.86 MIL/uL — ABNORMAL LOW (ref 4.22–5.81)
WBC: 6.3 10*3/uL (ref 4.0–10.5)

## 2011-07-04 LAB — BASIC METABOLIC PANEL
BUN: 17 mg/dL (ref 6–23)
CO2: 23 mEq/L (ref 19–32)
Chloride: 108 mEq/L (ref 96–112)
Creatinine, Ser: 1.64 mg/dL — ABNORMAL HIGH (ref 0.50–1.35)

## 2011-07-04 LAB — MRSA PCR SCREENING: MRSA by PCR: NEGATIVE

## 2011-07-04 LAB — GLUCOSE, CAPILLARY: Glucose-Capillary: 132 mg/dL — ABNORMAL HIGH (ref 70–99)

## 2011-07-04 LAB — PROCALCITONIN: Procalcitonin: <0.1 ng/mL

## 2011-07-04 MED ORDER — ONDANSETRON HCL 4 MG/2ML IJ SOLN: 4.0000 mg | Freq: Three times a day (TID) | INTRAMUSCULAR | Status: DC | PRN

## 2011-07-04 MED ORDER — ONDANSETRON HCL 4 MG/2ML IJ SOLN: 4.0000 mg | Freq: Four times a day (QID) | INTRAMUSCULAR | Status: DC | PRN

## 2011-07-04 MED ORDER — CEFTRIAXONE SODIUM 1 G IJ SOLR
1.0000 g | Freq: Once | INTRAMUSCULAR | Status: AC
  Administered 2011-07-04: 1 g via INTRAVENOUS
  Filled 2011-07-04: qty 10

## 2011-07-04 MED ORDER — POTASSIUM CHLORIDE 20 MEQ/15ML (10%) PO LIQD
ORAL | Status: AC
  Filled 2011-07-04: qty 30

## 2011-07-04 MED ORDER — ONDANSETRON HCL 4 MG/2ML IJ SOLN
4.0000 mg | Freq: Once | INTRAMUSCULAR | Status: DC
  Filled 2011-07-04: qty 2

## 2011-07-04 MED ORDER — FELODIPINE ER 10 MG PO TB24
10.0000 mg | ORAL_TABLET | Freq: Every day | ORAL | Status: DC
  Administered 2011-07-04 – 2011-07-06 (×3): 10 mg via ORAL
  Filled 2011-07-04 (×3): qty 1

## 2011-07-04 MED ORDER — ALBUTEROL SULFATE (5 MG/ML) 0.5% IN NEBU: 2.5000 mg | INHALATION_SOLUTION | RESPIRATORY_TRACT | Status: DC | PRN

## 2011-07-04 MED ORDER — SODIUM CHLORIDE 0.9 % IV BOLUS (SEPSIS): 1000.0000 mL | Freq: Once | INTRAVENOUS | Status: DC

## 2011-07-04 MED ORDER — ONDANSETRON HCL 4 MG PO TABS: 4.0000 mg | ORAL_TABLET | Freq: Four times a day (QID) | ORAL | Status: DC | PRN

## 2011-07-04 MED ORDER — MECLIZINE HCL 25 MG PO TABS
25.0000 mg | ORAL_TABLET | Freq: Four times a day (QID) | ORAL | Status: DC | PRN
  Administered 2011-07-04: 25 mg via ORAL
  Filled 2011-07-04: qty 1

## 2011-07-04 MED ORDER — SODIUM CHLORIDE 0.9 % IJ SOLN: 3.0000 mL | INTRAMUSCULAR | Status: DC | PRN

## 2011-07-04 MED ORDER — FUROSEMIDE 40 MG PO TABS
40.0000 mg | ORAL_TABLET | Freq: Every day | ORAL | Status: DC
  Administered 2011-07-04: 40 mg via ORAL
  Filled 2011-07-04: qty 1

## 2011-07-04 MED ORDER — DEXTROSE 5 % IV SOLN
1.0000 g | INTRAVENOUS | Status: DC
  Filled 2011-07-04: qty 10

## 2011-07-04 MED ORDER — ALUM & MAG HYDROXIDE-SIMETH 200-200-20 MG/5ML PO SUSP: 30.0000 mL | Freq: Four times a day (QID) | ORAL | Status: DC | PRN

## 2011-07-04 MED ORDER — ASPIRIN 325 MG PO TABS
325.0000 mg | ORAL_TABLET | Freq: Every day | ORAL | Status: DC
  Administered 2011-07-04 – 2011-07-06 (×3): 325 mg via ORAL
  Filled 2011-07-04 (×3): qty 1

## 2011-07-04 MED ORDER — DEXTROSE 5 % IV SOLN: 500.0000 mg | INTRAVENOUS | Status: DC

## 2011-07-04 MED ORDER — POTASSIUM CHLORIDE 20 MEQ/15ML (10%) PO LIQD
40.0000 meq | Freq: Once | ORAL | Status: AC
  Administered 2011-07-04: 40 meq via ORAL

## 2011-07-04 MED ORDER — SODIUM CHLORIDE 0.9 % IV SOLN
INTRAVENOUS | Status: AC
Start: 2011-07-04 — End: 2011-07-04
  Administered 2011-07-04: 08:00:00 via INTRAVENOUS

## 2011-07-04 MED ORDER — BENZONATATE 100 MG PO CAPS
100.0000 mg | ORAL_CAPSULE | Freq: Three times a day (TID) | ORAL | Status: DC | PRN
  Filled 2011-07-04: qty 1

## 2011-07-04 MED ORDER — IPRATROPIUM BROMIDE 0.02 % IN SOLN: 0.5000 mg | RESPIRATORY_TRACT | Status: DC | PRN

## 2011-07-04 MED ORDER — DEXTROSE 5 % IV SOLN
500.0000 mg | Freq: Once | INTRAVENOUS | Status: AC
  Administered 2011-07-04: 500 mg via INTRAVENOUS
  Filled 2011-07-04: qty 500

## 2011-07-04 MED ORDER — HEPARIN SODIUM (PORCINE) 5000 UNIT/ML IJ SOLN
5000.0000 [IU] | Freq: Three times a day (TID) | INTRAMUSCULAR | Status: DC
  Administered 2011-07-04 – 2011-07-06 (×6): 5000 [IU] via SUBCUTANEOUS
  Filled 2011-07-04 (×10): qty 1

## 2011-07-04 MED ORDER — SODIUM CHLORIDE 0.9 % IV BOLUS (SEPSIS)
1000.0000 mL | Freq: Once | INTRAVENOUS | Status: AC
  Administered 2011-07-04: 1000 mL via INTRAVENOUS

## 2011-07-04 MED ORDER — TAMSULOSIN HCL 0.4 MG PO CAPS
0.4000 mg | ORAL_CAPSULE | ORAL | Status: DC
  Administered 2011-07-04 – 2011-07-05 (×2): 0.4 mg via ORAL
  Filled 2011-07-04 (×2): qty 1

## 2011-07-04 NOTE — Progress Notes (Signed)
MD order for pt tx 5500, report called to charge RN, pt VSS, family at [facility] and updated, all questions answered, pt to be transported by night staff to 5500

## 2011-07-04 NOTE — Progress Notes (Signed)
Patient's temp orally is 98.2; took off bear hugger.  Will continue to monitor patient closely.   Vivi Martens RN

## 2011-07-04 NOTE — ED Provider Notes (Signed)
History     CSN: [id number]  Arrival date & time 07/03/11  2208   First MD Initiated Contact with Patient 07/03/11 2230      No chief complaint on file.   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 Patient lives with his daughter normally. Today during the day he had been fine and had actually gone to his primary care physician's office to receive his B12 injection. At 9:19 PM the patient apparently stopped responding to family. Upon arrival here the patient initially would not answer any questions per nursing. Patient then responded to all questions following sternal rub. He followed commands and was oriented to self and location. He did report an amnestic period. Patient had had no fevers. Prior to presentation he complained of some nausea and also diffuse tingling sensation. Patient is difficult to interview here as he only intermittently answers questions. Whenever he does answer his speech is within normal limits and patient is completely oriented. Family reports that the patient coughs all the time as he is a heavy smoker. There have been no other signs of infection but they have noted.  Patient has history of TIA but no history of stroke. Patient is afebrile and hemodynamically stable on presentation. There are no other associated or modifying factors. Past Medical History  Diagnosis Date  . Hypertension   . Anemia   . 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 CRI (chronic renal insufficiency)   . Depression   . Hyperhomocysteinemia   . 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 Venetia Maxon in 1997. Shunt replaced 06-2008  . Dizziness     Chronic paresthesias and dizziness not related with stroke  . Renal cyst     08-2009 Renal cysts( R ) at outside  hospital (seen also in a u/s 2007), [facility] for f/u u/s 11-11  . Deaf     L ear  . 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 Diverticulosis   . Colon polyps   . Internal hemorrhoids   . External hemorrhoids     Past Surgical History  Procedure Date  . Laparotomy     s/p remobal of swallowed of a FB from the stomach when he was 66 y/o    . Brain surgery     Family History  Problem Relation Age of Onset  . Colon cancer Mother     colon  . Cancer Brother     bladder  . Prostate cancer Neg Hx   . Diabetes Neg Hx   . Coronary artery disease      Uncle MI at age 79s  . Stroke Neg Hx     History  Substance Use Topics  . Smoking status: Current Everyday Smoker -- 1.5 packs/day    Types: Cigarettes  . Smokeless tobacco: Never Used   Comment: Sheet given in exam room for counseling to quit smoking   . Alcohol Use: No     social       Review of Systems  Constitutional: Negative.   HENT: Negative.   Eyes: Negative.   Respiratory: Positive for cough.   Cardiovascular: Negative.   Gastrointestinal: Positive for nausea.  Genitourinary: Negative.  Musculoskeletal: Negative.   Skin: Negative.   Neurological: Positive for dizziness.  Hematological: Negative.   Psychiatric/Behavioral: Negative.   All other systems reviewed and are negative.    Allergies  Varenicline tartrate  Home Medications   Current Outpatient Rx  Name Route Sig Dispense Refill  . ASPIRIN 325 MG PO TABS Oral Take 325 mg by mouth daily.      . CYANOCOBALAMIN 1000 MCG/ML IJ SOLN Intramuscular Inject 1 mL (1,000 mcg total) into the muscle every 30 (thirty) days. Diagnosis 280.1 10 mL 0  . FELODIPINE ER 10 MG PO TB24  TAKE ONE TABLET BY MOUTH EVERY DAY 30 tablet 2  . FOLIC ACID PO Oral Take 1 tablet by mouth daily.    . FUROSEMIDE 40 MG PO TABS  TAKE ONE TABLET BY MOUTH EVERY DAY 30 tablet 2  . TAMSULOSIN HCL 0.4 MG PO CAPS Oral Take 1 capsule (0.4 mg total)  by mouth daily after supper. 30 capsule 6    BP 127/70  Pulse 58  Resp 14  SpO2 95%  Physical Exam  Nursing note and vitals reviewed. GEN: Well-developed, well-nourished male in no distress, breathing easily but only intermittently answering questions. HEENT: Atraumatic, normocephalic. Oropharynx clear without erythema EYES: PERRLA BL, no scleral icterus, extraocular movements intact. NECK: Trachea midline, no meningismus CV: Slight bradycardia with regular rhythm. No murmurs, rubs, or gallops PULM: No respiratory distress.  No crackles, wheezes, or rales. GI: soft, non-tender. No guarding, rebound, or tenderness. + bowel sounds  GU: deferred Neuro: Patient is a difficult exam secondary to cooperation. Pupils are equal and reactive. When patient does respond he is able to follow commands with all 4 extremities. He is oriented x3. Patient has speech within normal limits and no facial droop. He denies any sensory deficits. Family reports this is very different from the patient's baseline as he normally walks independently and even drives without any difficulties.  MSK: Patient moves all 4 extremities, no deformity, edema, or injury noted Skin: No rashes petechiae, purpura, or jaundice Psych: no abnormality of mood   ED Course  Procedures (including critical care time)  CRITICAL CARE Performed by: Cyndra Numbers   Total critical care time: 30 minutes  Critical care time was exclusive of separately billable procedures and treating other patients.  Critical care was necessary to treat or prevent imminent or life-threatening deterioration.  Critical care was time spent personally by me on the following activities: development of treatment plan with patient and/or surrogate as well as nursing, discussions with consultants, evaluation of patient's response to treatment, examination of patient, obtaining history from patient or surrogate, ordering and performing treatments and  interventions, ordering and review of laboratory studies, ordering and review of radiographic studies, pulse oximetry and re-evaluation of patient's condition.   Date: 07/04/2011  Rate: 54  Rhythm: sinus bradycardia  QRS Axis: normal  Intervals: normal  ST/T Wave abnormalities: normal  Conduction Disutrbances:none  Narrative Interpretation:   Old EKG Reviewed: Bradycardic compared to previous.   Labs Reviewed  CBC - Abnormal; Notable for the following:    WBC 10.7 (*)    All other components within normal limits  COMPREHENSIVE METABOLIC PANEL - Abnormal; Notable for the following:    Potassium 3.1 (*)    Glucose, Bld 143 (*)    Creatinine, Ser 2.00 (*)    GFR calc non Af Amer 33 (*)    GFR calc Af Amer 39 (*)    All other components within normal limits  URINALYSIS,  ROUTINE W REFLEX MICROSCOPIC - Abnormal; Notable for the following:    Protein, ur 30 (*)    All other components within normal limits  DIFFERENTIAL  PROTIME-INR  AMMONIA  LACTIC ACID, PLASMA  TROPONIN I  URINE MICROSCOPIC-ADD ON  PROCALCITONIN  CULTURE, BLOOD (ROUTINE X 2)  CULTURE, BLOOD (ROUTINE X 2)   Dg Chest 2 View  07/03/2011  *RADIOLOGY REPORT*  Clinical Data: Dizziness  CHEST - 2 VIEW  Comparison: 01/02/2011  Findings: Normal heart size. Consolidation is present in the retrocardiac left base.  VP shunt catheters over the right and left chest are intact.  No pneumothorax.  IMPRESSION: Left lower lobe airspace disease.  Original Report Authenticated By: Donavan Burnet, M.D.   Ct Head Wo Contrast  07/03/2011  *RADIOLOGY REPORT*  Clinical Data: Dizziness  CT HEAD WITHOUT CONTRAST  Technique:  Contiguous axial images were obtained from the base of the skull through the vertex without contrast.  Comparison: None.  Findings: Several ventricular peritoneal shunt catheters are in place and not significantly changed.  The cystic lesion in the region of the pineal gland has increased in size.  Today it measures 3.2  x 1.7 cm.  Previously measured only 2.3 cm in greatest diameter.  Ventricles system remains decompressed.  No acute intracranial hemorrhage.  No midline shift.  Mastoid air cells are clear.  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 Stable VP shunt catheters with decompression of the ventricular system.  Original Report Authenticated By: Donavan Burnet, M.D.     1. CAP (community acquired pneumonia)   2. Altered mental status       MDM  Patient is a 66 year old male who presented today do to sudden altered mental status.  Here patient will now follow commands and is oriented. Patient had workup for possible causes of this. Patient had initial axillary temp of 97 3.  Patient was normotensive and mildly bradycardic. He did not appear to be in distress. Patient did have an EKG that was remarkable for bradycardia. He is a leukocytosis of 10.7. Urinalysis was within normal limits. Renal function did show elevated BUN/creatinine ratio but creatinine was around normal value for the patient. Patient was hypokalemic to 3.1. Chest x-ray did show left lower lobe airspace disease. The patient has not been hospitalized for the past 90 days. Blood cultures were ordered and Rocephin and azithromycin were given IV. Patient had urine culture sent as well. Lactate was 1.1. Patient had  IV normal saline ordered. Repeat temperature was noted to be 95.2 rectally. This was confirmed with another thermometer given that patient had otherwise normal vital signs with blood pressure of 120s over 70s, respirations of 14, O2 sat 95% on room air, and pulse in the 60s. They're however was ordered. Patient's CT did show increased size of his pineal gland cyst. Patient was discussed with Dr. Danielle Dess who is the neurosurgeon on call for the patient's neurosurgeon, Dr. Venetia Maxon. As the ventricles were well decompressed and patient had another explanation for altered mental status neurosurgery felt comfortable  that no intervention was needed this evening. I spoke with Dr. Hanley Hays who was on for triad hospitalist. He requested discussion with critical care. I discussed the patient with the critical care physician on-call, Dr. Cyril Loosen.  He agreed with current plan of care and felt that admission to step down unit would be appropriate. I spoke with Dr. Hanley Hays again who accepted the patient for step down. Patient will be admitted  for further management.          Cyndra Numbers, MD 07/04/11 [phone]

## 2011-07-04 NOTE — H&P (Signed)
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 He states he was with his grandchildren when he developed sudden onset of diaphoresis and dizziness. The symptoms continued to worsen. He states he could hardly walk, he had to crawl on the floor. His diaphoresis and dizziness worsened and he developed nausea and vomiting. He does have intermittent dizziness with his pineal tumor, however, this was significantly worse. His daughter was called and he was taken to [facility] ER. During their interview the patient was confused and minimally conversant. He was found to have pneumonia and a core temperature of 94.7. He has a VP shunt, CT head done at [facility] revealed interval increase in the cystic pineal lesion. However, there was decompression of the ventricular system. Per ER doctor [facility] this was discussed with neurosurgery Dr. Venetia Maxon, who stated, that as the ventricles were well decompressed and patient had another explanation for altered mental status neurosurgery felt comfortable that no emergent intervention was needed, recall in the a.m. Patient was also discussed with the CCM Dr. Cyril Loosen who recommended admission to [facility]. During my interview patient was alert and oriented x3. In no discomfort. He does report some vague abdominal discomfort. He states this is not new. He states he's had this intermittently for almost a year. History provided solely by the patient. He denies any cough, shortness of breath, palpitations, burning urination, fevers, diarrhea. He states his dizziness is improved. No family member currently at bedside.  Review of Systems: Positives bolded  The patient denies anorexia, fever, weight loss,, vision loss, decreased hearing, hoarseness, chest pain, syncope, dyspnea on exertion, peripheral edema, balance deficits,  hemoptysis, abdominal pain, melena, hematochezia, severe indigestion/heartburn, hematuria, incontinence, genital sores, muscle weakness, suspicious skin lesions, transient blindness, difficulty walking, depression, unusual weight change, abnormal bleeding, enlarged lymph nodes, angioedema, and breast masses.  Past Medical History: Past Medical History  Diagnosis Date  . Hypertension   . Anemia   . 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 CRI (chronic renal insufficiency)   . Depression   . Hyperhomocysteinemia   . 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 Venetia Maxon in 1997. Shunt replaced 06-2008  . Dizziness     Chronic paresthesias and dizziness not related with stroke  . Renal cyst     08-2009 Renal cysts( R ) at outside hospital (seen also in a u/s 2007), [facility] for f/u u/s 11-11  . Deaf     L ear  . 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 Diverticulosis   . Colon polyps   . Internal hemorrhoids   . External hemorrhoids    Past Surgical History  Procedure Date  . Laparotomy     s/p remobal of swallowed of a FB from the stomach when he was 66 y/o    . Brain surgery     Medications: Prior to Admission medications   Medication Sig Start Date End Date Taking? Authorizing Provider  aspirin 325 MG tablet Take 325 mg by mouth daily.     Yes Historical Provider, MD  cyanocobalamin (,VITAMIN B-12,) 1000 MCG/ML injection Inject 1 mL (1,000 mcg total) into the muscle every 30 (thirty) days. Diagnosis  280.1 04/28/11  Yes Wanda Plump, MD  felodipine (PLENDIL) 10 MG 24 hr tablet TAKE ONE TABLET BY MOUTH EVERY DAY 03/20/11  Yes Wanda Plump, MD  FOLIC ACID PO Take 1 tablet by mouth daily.   Yes Historical Provider, MD  furosemide (LASIX) 40 MG tablet TAKE ONE TABLET BY MOUTH EVERY DAY 03/20/11  Yes Wanda Plump, MD  Tamsulosin HCl (FLOMAX) 0.4 MG CAPS Take 1 capsule (0.4 mg total) by mouth daily after supper.  12/12/10  Yes Wanda Plump, MD    Allergies:   Allergies  Allergen Reactions  . Varenicline Tartrate     Social History:  reports that he has been smoking Cigarettes.  He has been smoking about 1.5 packs per day. He has never used smokeless tobacco. He reports that he does not drink alcohol or use illicit drugs. patient states he's not a fall risk as his dizziness is quite infrequent  Family History: Family History  Problem Relation Age of Onset  . Colon cancer Mother     colon  . Cancer Brother     bladder  . Prostate cancer Neg Hx   . Diabetes Neg Hx   . Coronary artery disease      Uncle MI at age 69s  . Stroke Neg Hx     Physical Exam: Filed Vitals:   07/03/11 2214 07/04/11 0055 07/04/11 0129 07/04/11 0225  BP: 127/70 122/70  118/79  Pulse: 58 61    Temp:  95.2 F (35.1 C) 94.7 F (34.8 C) 97.4 F (36.3 C)  TempSrc: Oral   Oral  Resp: 14 14  14   Height:    6\' 2"  (1.88 m)  Weight:    75.8 kg (167 lb 1.7 oz)  SpO2: 95% 91%  100%    General:  Alert and oriented times three, well developed and nourished, no acute distress Eyes: PERRLA, pink conjunctiva, no scleral icterus ENT: Moist oral mucosa, neck supple, no thyromegaly Lungs: clear to ascultation, no wheeze, no crackles, no use of accessory muscles Cardiovascular: regular rate and rhythm, no regurgitation, no gallops, no murmurs. No carotid bruits, no JVD Abdomen: soft, positive BS, non-tender, non-distended, no organomegaly, not an acute abdomen GU: not examined Neuro: CN II - XII grossly intact, sensation intact Musculoskeletal: strength 5/5 all extremities, no clubbing, cyanosis or edema Skin: no rash, no subcutaneous crepitation, no decubitus Psych: appropriate patient   Labs on Admission:   [facility] 07/03/11 2231  NA 138  K 3.1*  CL 99  CO2 27  GLUCOSE 143*  BUN 20  CREATININE 2.00*  CALCIUM 8.9  MG --  PHOS --    Basename 07/03/11 2231  AST 12  ALT 9  ALKPHOS 109  BILITOT 0.3  PROT  6.9  ALBUMIN 4.0   No results found for this basename: LIPASE:2,AMYLASE:2 in the last 72 hours  Basename 07/03/11 2231  WBC 10.7*  NEUTROABS 6.8  HGB 13.3  HCT 39.2  MCV 84.5  PLT 260    Basename 07/03/11 2231  CKTOTAL --  CKMB --  CKMBINDEX --  TROPONINI <0.30   No components found with this basename: POCBNP:3 No results found for this basename: DDIMER:2 in the last 72 hours No results found for this basename: HGBA1C:2 in the last 72 hours No results found for this basename: CHOL:2,HDL:2,LDLCALC:2,TRIG:2,CHOLHDL:2,LDLDIRECT:2 in the last 72 hours No results found for this basename: TSH,T4TOTAL,FREET3,T3FREE,THYROIDAB in the last 72 hours No results found for this basename: VITAMINB12:2,FOLATE:2,FERRITIN:2,TIBC:2,IRON:2,RETICCTPCT:2 in the last 72  hours  Micro Results: No results found for this or any previous visit (from the past 240 hour(s)). Results for DUNG, SALINGER (MRN [id number]) as of 07/04/2011 02:46  Ref. Range 07/03/2011 23:05  Color, Urine Latest Range: YELLOW  YELLOW  APPearance Latest Range: CLEAR  CLEAR  Specific Gravity, Urine Latest Range: 1.005-1.030  1.019  pH Latest Range: 5.0-8.0  7.5  Glucose, UA Latest Range: NEGATIVE mg/dL NEGATIVE  Bilirubin Urine Latest Range: NEGATIVE  NEGATIVE  Ketones, ur Latest Range: NEGATIVE mg/dL NEGATIVE  Protein Latest Range: NEGATIVE mg/dL 30 (A)  Urobilinogen, UA Latest Range: 0.0-1.0 mg/dL 1.0  Nitrite Latest Range: NEGATIVE  NEGATIVE  Leukocytes, UA Latest Range: NEGATIVE  NEGATIVE  Hgb urine dipstick Latest Range: NEGATIVE  NEGATIVE  WBC, UA Latest Range: <3 WBC/hpf 0-2  RBC / HPF Latest Range: <3 RBC/hpf 0-2  Squamous Epithelial / LPF Latest Range: RARE  RARE  Bacteria, UA Latest Range: RARE  RARE    Radiological Exams on Admission: Dg Chest 2 View  07/03/2011  *RADIOLOGY REPORT*  Clinical Data: Dizziness  CHEST - 2 VIEW  Comparison: 01/02/2011  Findings: Normal heart size. Consolidation is present in the  retrocardiac left base.  VP shunt catheters over the right and left chest are intact.  No pneumothorax.  IMPRESSION: Left lower lobe airspace disease.  Original Report Authenticated By: Donavan Burnet, M.D.   Ct Head Wo Contrast  07/03/2011  *RADIOLOGY REPORT*  Clinical Data: Dizziness  CT HEAD WITHOUT CONTRAST  Technique:  Contiguous axial images were obtained from the base of the skull through the vertex without contrast.  Comparison: None.  Findings: Several ventricular peritoneal shunt catheters are in place and not significantly changed.  The cystic lesion in the region of the pineal gland has increased in size.  Today it measures 3.2 x 1.7 cm.  Previously measured only 2.3 cm in greatest diameter.  Ventricles system remains decompressed.  No acute intracranial hemorrhage.  No midline shift.  Mastoid air cells are clear.  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 Stable VP shunt catheters with decompression of the ventricular system.  Original Report Authenticated By: Donavan Burnet, M.D.    Assessment/Plan Present on Admission:  Community-acquired pneumonia Hypothermia-resolved  COPD Tobacco abuse Patient admitted to step down  Sputum cultures and blood cultures collected  Continue antibiotics as azithromycin and Rocephin Oxygen, nebulizers, Tessalon Perles ordered Nicotine patch ordered  Here in the step down patient's temperature was 97.4 oral. 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 When asked is his family his family aware of this, he states no. I recommended informing his daughter in the a.m. I'm told she is power of attorney. During my interview patient was alert and oriented x3.  DO  NOT RESUSCITATE DVT prophylaxis Team 1/Dr. Moises Blood, Kaileia Flow 07/04/2011, 3:22 AM

## 2011-07-04 NOTE — Progress Notes (Signed)
[facility]  Interim history: 66 y/o male admitted with dizziness, vomiting and found to have a LLL infiltrate. He was started on PNA treatment.  HE has a VP shunt and there was concern of blockage - Dr Venetia Maxon evaluated the head CT and did not find any abnormality  Subjective: He is awake and alert and complaining of nausea. No recent symptoms of cough or URI. I spoke with his daughter who has not seen symptoms of respiratory infection either.   Objective: Blood pressure 125/74, pulse 53, temperature 98.9 F (37.2 C), temperature source Oral, resp. rate 14, height 6\' 2"  (1.88 m), weight 75.8 kg (167 lb 1.7 oz), SpO2 96.00%. Weight change:   Intake/Output Summary (Last 24 hours) at 07/04/11 1738 Last data filed at 07/04/11 1300  Gross per 24 hour  Intake    360 ml  Output    900 ml  Net   -540 ml    Physical Exam: General appearance: alert, cooperative and fatigued Lungs: clear to auscultation bilaterally Heart: regular rate and rhythm, S1, S2 normal, no murmur, click, rub or gallop Abdomen: tenderness in epigastrium, bowel sounds positive, non-distended Extremities: extremities normal, atraumatic, no cyanosis or edema  Lab Results:  [facility] 07/04/11 0515 07/03/11 2231  NA 140 138  K 3.9 3.1*  CL 108 99  CO2 23 27  GLUCOSE 116* 143*  BUN 17 20  CREATININE 1.64* 2.00*  CALCIUM 7.9* 8.9  MG -- --  PHOS -- --    Basename 07/03/11 2231  AST 12  ALT 9  ALKPHOS 109  BILITOT 0.3  PROT 6.9  ALBUMIN 4.0   No results found for this basename: LIPASE:2,AMYLASE:2 in the last 72 hours  Basename 07/04/11 0515 07/03/11 2231  WBC 6.3 10.7*  NEUTROABS -- 6.8  HGB 10.8* 13.3  HCT 32.7* 39.2  MCV 84.7 84.5  PLT 185 260    Basename 07/03/11 2231  CKTOTAL --  CKMB --  CKMBINDEX --  TROPONINI <0.30   No components found with this basename: POCBNP:3 No results found for this basename: DDIMER:2 in the last 72 hours No results found for this basename: HGBA1C:2 in  the last 72 hours No results found for this basename: CHOL:2,HDL:2,LDLCALC:2,TRIG:2,CHOLHDL:2,LDLDIRECT:2 in the last 72 hours No results found for this basename: TSH,T4TOTAL,FREET3,T3FREE,THYROIDAB in the last 72 hours No results found for this basename: VITAMINB12:2,FOLATE:2,FERRITIN:2,TIBC:2,IRON:2,RETICCTPCT:2 in the last 72 hours  Micro Results: Recent Results (from the past 240 hour(s))  MRSA PCR SCREENING     Status: Normal   Collection Time   07/04/11  7:59 AM      Component Value Range Status Comment   MRSA by PCR NEGATIVE  NEGATIVE  Final     Studies/Results: Dg Chest 2 View  07/03/2011  *RADIOLOGY REPORT*  Clinical Data: Dizziness  CHEST - 2 VIEW  Comparison: 01/02/2011  Findings: Normal heart size. Consolidation is present in the retrocardiac left base.  VP shunt catheters over the right and left chest are intact.  No pneumothorax.  IMPRESSION: Left lower lobe airspace disease.  Original Report Authenticated By: Donavan Burnet, M.D.   Ct Head Wo Contrast  07/03/2011  *RADIOLOGY REPORT*  Clinical Data: Dizziness  CT HEAD WITHOUT CONTRAST  Technique:  Contiguous axial images were obtained from the base of the skull through the vertex without contrast.  Comparison: None.  Findings: Several ventricular peritoneal shunt catheters are in place and not significantly changed.  The cystic lesion in the region of the pineal gland has increased in size.  Today it measures 3.2 x 1.7 cm.  Previously measured only 2.3 cm in greatest diameter.  Ventricles system remains decompressed.  No acute intracranial hemorrhage.  No midline shift.  Mastoid air cells are clear.  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 Stable VP shunt catheters with decompression of the ventricular system.  Original Report Authenticated By: Donavan Burnet, M.D.    Medications: Scheduled Meds:   . sodium chloride   Intravenous STAT  . aspirin  325 mg Oral Daily  . azithromycin  500 mg  Intravenous Once  . cefTRIAXone (ROCEPHIN)  IV  1 g Intravenous Once  . felodipine  10 mg Oral Daily  . heparin  5,000 Units Subcutaneous Q8H  . ondansetron (ZOFRAN) IV  4 mg Intravenous Once  . ondansetron (ZOFRAN) IV  4 mg Intravenous Once  . potassium chloride  40 mEq Oral Once  . potassium chloride      . sodium chloride  1,000 mL Intravenous Once  . sodium chloride  1,000 mL Intravenous Once  . sodium chloride  1,000 mL Intravenous Once  . Tamsulosin HCl  0.4 mg Oral PC supper  . DISCONTD: azithromycin  500 mg Intravenous Q24H  . DISCONTD: cefTRIAXone (ROCEPHIN)  IV  1 g Intravenous Q24H  . DISCONTD: furosemide  40 mg Oral Daily   Continuous Infusions:  PRN Meds:.albuterol, alum & mag hydroxide-simeth, benzonatate, ipratropium, ondansetron (ZOFRAN) IV, ondansetron, sodium chloride, DISCONTD: ondansetron (ZOFRAN) IV  Assessment/Plan: Nausea/ vomiting Gastroenteritis?? Clear liquids for now.  IV fluid for 12 hrs - 7am -7pm.  Check Lipase. LFTs normal.   ARF on CKD 3 Improving - back to baseline D/C Lasix- He only take it when legs swollen- discussed w/ daughter  Nicotine abuse Has been counseled.   BPH Resume Flomax  B12 deffiency  Disposition tx out of ICU to med/surg     LOS: 1 day   [facility] [phone] 07/04/2011, 5:38 PM

## 2011-07-04 NOTE — ED Notes (Signed)
Report given to carelink and Psychologist, educational, pt arouses to voice, alert and oriented, bp stable, currently on bear hugger, pt being transpored at this time

## 2011-07-05 ENCOUNTER — Inpatient Hospital Stay (HOSPITAL_COMMUNITY): Payer: Medicare Other

## 2011-07-05 LAB — URINE CULTURE
Colony Count: NO GROWTH
Culture  Setup Time: 201304270735
Culture: NO GROWTH

## 2011-07-05 LAB — CBC
MCV: 85.7 fL (ref 78.0–100.0)
Platelets: 193 10*3/uL (ref 150–400)
RBC: 3.98 MIL/uL — ABNORMAL LOW (ref 4.22–5.81)
WBC: 5.4 10*3/uL (ref 4.0–10.5)

## 2011-07-05 LAB — BASIC METABOLIC PANEL
CO2: 25 mEq/L (ref 19–32)
Calcium: 8.3 mg/dL — ABNORMAL LOW (ref 8.4–10.5)
GFR calc Af Amer: 44 mL/min — ABNORMAL LOW (ref >90)
GFR calc non Af Amer: 38 mL/min — ABNORMAL LOW (ref >90)
Sodium: 142 mEq/L (ref 135–145)

## 2011-07-05 MED ORDER — FOLIC ACID 1 MG PO TABS
1.0000 mg | ORAL_TABLET | Freq: Every day | ORAL | Status: DC
  Administered 2011-07-05 – 2011-07-06 (×2): 1 mg via ORAL
  Filled 2011-07-05 (×2): qty 1

## 2011-07-05 NOTE — Progress Notes (Addendum)
[facility]  Interim history: 66 y/o male admitted with dizziness, vomiting and found to have a LLL infiltrate. He was started on PNA treatment.  HE has a VP shunt and there was concern of blockage - Dr Venetia Maxon evaluated the head CT and did not find any abnormality  Subjective: Nausea, Vomiting, Abdominal pain, Dizziness has all resolved.   Objective: Blood pressure 99/64, pulse 68, temperature 98.6 F (37 C), temperature source Oral, resp. rate 18, height 6\' 2"  (1.88 m), weight 75.8 kg (167 lb 1.7 oz), SpO2 98.00%. Weight change:   Intake/Output Summary (Last 24 hours) at 07/05/11 1513 Last data filed at 07/05/11 0500  Gross per 24 hour  Intake   1035 ml  Output      0 ml  Net   1035 ml    Physical Exam: General appearance: alert, cooperative and fatigued Lungs: clear to auscultation bilaterally Heart: regular rate and rhythm, S1, S2 normal, no murmur, click, rub or gallop Abdomen: tenderness in epigastrium, bowel sounds positive, non-distended Extremities: extremities normal, atraumatic, no cyanosis or edema  Lab Results:  [facility] 07/05/11 0650 07/04/11 0515  NA 142 140  K 3.6 3.9  CL 110 108  CO2 25 23  GLUCOSE 81 116*  BUN 14 17  CREATININE 1.81* 1.64*  CALCIUM 8.3* 7.9*  MG -- --  PHOS -- --    Basename 07/03/11 2231  AST 12  ALT 9  ALKPHOS 109  BILITOT 0.3  PROT 6.9  ALBUMIN 4.0    Basename 07/04/11 1800  LIPASE 195*  AMYLASE --    Basename 07/05/11 0650 07/04/11 0515 07/03/11 2231  WBC 5.4 6.3 --  NEUTROABS -- -- 6.8  HGB 11.2* 10.8* --  HCT 34.1* 32.7* --  MCV 85.7 84.7 --  PLT 193 185 --    Basename 07/03/11 2231  CKTOTAL --  CKMB --  CKMBINDEX --  TROPONINI <0.30   No components found with this basename: POCBNP:3 No results found for this basename: DDIMER:2 in the last 72 hours No results found for this basename: HGBA1C:2 in the last 72 hours No results found for this basename:  CHOL:2,HDL:2,LDLCALC:2,TRIG:2,CHOLHDL:2,LDLDIRECT:2 in the last 72 hours No results found for this basename: TSH,T4TOTAL,FREET3,T3FREE,THYROIDAB in the last 72 hours No results found for this basename: VITAMINB12:2,FOLATE:2,FERRITIN:2,TIBC:2,IRON:2,RETICCTPCT:2 in the last 72 hours  Micro Results: Recent Results (from the past 240 hour(s))  CULTURE, BLOOD (ROUTINE X 2)     Status: Normal (Preliminary result)   Collection Time   07/04/11 12:30 AM      Component Value Range Status Comment   Specimen Description BLOOD RIGHT ARM   Final    Special Requests BOTTLES DRAWN AEROBIC AND ANAEROBIC  5CC EACH   Final    Culture  Setup Time [id number]   Final    Culture     Final    Value:        BLOOD CULTURE RECEIVED NO GROWTH TO DATE CULTURE WILL BE HELD FOR 5 DAYS BEFORE ISSUING A FINAL NEGATIVE REPORT   Report Status PENDING   Incomplete   CULTURE, BLOOD (ROUTINE X 2)     Status: Normal (Preliminary result)   Collection Time   07/04/11 12:30 AM      Component Value Range Status Comment   Specimen Description BLOOD LEFT ARM   Final    Special Requests     Final    Value: BOTTLES DRAWN AEROBIC AND ANAEROBIC 5CC AEROBIC, [facility] ANAEROBIC   Culture  Setup Time [id number]  Final    Culture     Final    Value:        BLOOD CULTURE RECEIVED NO GROWTH TO DATE CULTURE WILL BE HELD FOR 5 DAYS BEFORE ISSUING A FINAL NEGATIVE REPORT   Report Status PENDING   Incomplete   MRSA PCR SCREENING     Status: Normal   Collection Time   07/04/11  7:59 AM      Component Value Range Status Comment   MRSA by PCR NEGATIVE  NEGATIVE  Final     Studies/Results: Dg Chest 2 View  07/03/2011  *RADIOLOGY REPORT*  Clinical Data: Dizziness  CHEST - 2 VIEW  Comparison: 01/02/2011  Findings: Normal heart size. Consolidation is present in the retrocardiac left base.  VP shunt catheters over the right and left chest are intact.  No pneumothorax.  IMPRESSION: Left lower lobe airspace disease.  Original Report Authenticated By:  Donavan Burnet, M.D.   Ct Head Wo Contrast  07/03/2011  *RADIOLOGY REPORT*  Clinical Data: Dizziness  CT HEAD WITHOUT CONTRAST  Technique:  Contiguous axial images were obtained from the base of the skull through the vertex without contrast.  Comparison: None.  Findings: Several ventricular peritoneal shunt catheters are in place and not significantly changed.  The cystic lesion in the region of the pineal gland has increased in size.  Today it measures 3.2 x 1.7 cm.  Previously measured only 2.3 cm in greatest diameter.  Ventricles system remains decompressed.  No acute intracranial hemorrhage.  No midline shift.  Mastoid air cells are clear.  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 Stable VP shunt catheters with decompression of the ventricular system.  Original Report Authenticated By: Donavan Burnet, M.D.    Medications: Scheduled Meds:    . sodium chloride   Intravenous STAT  . aspirin  325 mg Oral Daily  . felodipine  10 mg Oral Daily  . heparin  5,000 Units Subcutaneous Q8H  . ondansetron (ZOFRAN) IV  4 mg Intravenous Once  . potassium chloride      . sodium chloride  1,000 mL Intravenous Once  . sodium chloride  1,000 mL Intravenous Once  . Tamsulosin HCl  0.4 mg Oral PC supper   Continuous Infusions:  PRN Meds:.alum & mag hydroxide-simeth, benzonatate, meclizine, ondansetron (ZOFRAN) IV, ondansetron, sodium chloride, DISCONTD: albuterol, DISCONTD: ipratropium  Assessment/Plan: Nausea/ vomiting/abd Pain -resolved -?2/2 pancreatitis-Lipase elevated, however CT Scan shows no pancreatic/peripancreatic inflammation, no gallstones mentioned in CT report. Emphatically denies ETOH history -advance diet to full liquids, and then to a regular heart healthy diet  Dizziness -present on admit -now resolved -? Was he orthostatic-from vomiting -currently ambulating in room ambulating without difficulty  ARF on CKD 3 Improving - back to baseline D/C  Lasix- He only take it when legs swollen- discussed w/ daughter  HTN -Controlled with Plendil  Nicotine abuse Has been counseled.   BPH Resume Flomax  B12 deffiency  Brain tumor, pineal gland  Dx in the 70s, s/p a shunt; revised by Dr. Venetia Maxon in 1997. Shunt replaced 06-2008  -CT head done at [facility] revealed interval increase in the cystic pineal lesion. However, there was decompression of the ventricular system. Per ER doctor [facility] this was discussed with neurosurgery Dr. Venetia Maxon, who stated, that as the ventricles were well decompressed and patient had another explanation for altered mental status neurosurgery felt comfortable that no emergent intervention was needed-this issue appears stable, now back to usual mental status  B/L renal masses/complex cysts -further work up to be done as outpatient-discussed with daughter at bedside   DVT prophylaxis -Subcutaneous Heparin  Disposition Remain inpatient-likley home in am   LOS: 2 days   [facility] [phone] 07/05/2011, 3:13 PM

## 2011-07-05 NOTE — Progress Notes (Signed)
Patient refuses bed alarm to be placed on.  Gait is steady and call light is within reach; reviewed with safety procedures.  Acknowledges understanding.  Will continue to monitor patient. Teniyah Seivert, Joan Mayans, RN

## 2011-07-05 NOTE — Progress Notes (Signed)
Patient transferred at approximately 07/04/11 at 21:00.  He is alert and oriented times three.  Oriented to unit, fall and safety procedures, and call bell (which is within patient reach).  He acknowledges understanding.  POC: safety, orientation, and abdominal processes.  Will continue to monitor. Mikel Pyon, Joan Mayans, RN

## 2011-07-06 DIAGNOSIS — R1110 Vomiting, unspecified: Secondary | ICD-10-CM | POA: Diagnosis present

## 2011-07-06 DIAGNOSIS — R001 Bradycardia, unspecified: Secondary | ICD-10-CM | POA: Diagnosis present

## 2011-07-06 DIAGNOSIS — R918 Other nonspecific abnormal finding of lung field: Secondary | ICD-10-CM | POA: Diagnosis present

## 2011-07-06 LAB — BASIC METABOLIC PANEL
CO2: 22 mEq/L (ref 19–32)
Calcium: 8.6 mg/dL (ref 8.4–10.5)
Creatinine, Ser: 1.84 mg/dL — ABNORMAL HIGH (ref 0.50–1.35)
GFR calc non Af Amer: 37 mL/min — ABNORMAL LOW (ref >90)
Glucose, Bld: 79 mg/dL (ref 70–99)
Sodium: 141 mEq/L (ref 135–145)

## 2011-07-06 NOTE — Progress Notes (Signed)
Nsg Discharge Note  Admit Date:  07/03/2011 Discharge date: 07/06/2011   [facility] to be D/C'd Home per MD order.  AVS completed.  Copy for chart, and copy for patient signed, and dated. Patient/caregiver able to verbalize understanding.  Discharge Medication:  [name], [name]  Home Medication Instructions EAV:[id number]   Printed on:07/06/11 1235  Medication Information                    aspirin 325 MG tablet Take 325 mg by mouth daily.             Tamsulosin HCl (FLOMAX) 0.4 MG CAPS Take 1 capsule (0.4 mg total) by mouth daily after supper.           furosemide (LASIX) 40 MG tablet TAKE ONE TABLET BY MOUTH EVERY DAY           cyanocobalamin (,VITAMIN B-12,) 1000 MCG/ML injection Inject 1 mL (1,000 mcg total) into the muscle every 30 (thirty) days. Diagnosis 280.1           FOLIC ACID PO Take 1 tablet by mouth daily.             Discharge Assessment: Filed Vitals:   07/06/11 0600  BP: 95/61  Pulse: 43  Temp: 98.2 F (36.8 C)  Resp: 16   Skin clean, dry and intact without evidence of skin break down, no evidence of skin tears noted. IV catheter discontinued intact. Site without signs and symptoms of complications - no redness or edema noted at insertion site, patient denies c/o pain - only slight tenderness at site.  Dressing with slight pressure applied.  D/c Instructions-Education: Discharge instructions given to patient/family with verbalized understanding. D/c education completed with patient/family including follow up instructions, medication list, d/c activities limitations if indicated, with other d/c instructions as indicated by MD - patient able to verbalize understanding, all questions fully answered. Patient instructed to return to ED, call 911, or call MD for any changes in condition.  Patient escorted via WC, and D/C home via private auto.  [name] Consuella Lose, RN 07/06/2011 12:35 PM

## 2011-07-06 NOTE — Progress Notes (Signed)
Utilization review completed.  

## 2011-07-06 NOTE — Discharge Summary (Signed)
Patient ID: Zachary Fields MRN: [id number] DOB/AGE: [birth year] 66 y.o.  Admit date: 07/03/2011 Discharge date: 07/06/2011  Primary Care Physician:  Willow Ora, MD, MD  Discharge Diagnoses:   Present on Admission. 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      cyanocobalamin 1000 MCG/ML injection   Commonly known as: (VITAMIN B-12)   Inject 1 mL (1,000 mcg total) into the muscle every 30 (thirty) days. Diagnosis 280.1      FOLIC ACID PO   Take 1 tablet by mouth daily.      furosemide 40 MG tablet   Commonly known as: LASIX   TAKE ONE TABLET BY MOUTH EVERY DAY as needed for leg swelling.      Tamsulosin HCl 0.4 MG Caps   Commonly known as: FLOMAX   Take 1 capsule (0.4 mg total) by mouth daily after supper.            Consults: Neurosurgery, Dr. 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 He states he was with his grandchildren when he developed sudden onset of diaphoresis and dizziness. The symptoms continued to worsen. He states he could hardly walk, he had to crawl on the floor. His diaphoresis and dizziness worsened and he developed nausea and vomiting. He does have intermittent dizziness with his pineal tumor, however, this was significantly worse. His daughter was called and he was taken to  [facility] ER. During their interview the patient was confused and minimally conversant. He was found to have pneumonia and a core temperature of 94.7. He has a VP shunt, CT head done at [facility] revealed interval increase in the cystic pineal lesion. However, there was decompression of the ventricular system. Per ER doctor [facility] this was discussed with neurosurgery Dr. Venetia Maxon, who stated, that as the ventricles were well decompressed and patient had another explanation for altered mental status neurosurgery felt comfortable that no emergent intervention was needed, recall in the a.m. Patient was also discussed with the CCM Dr. Cyril Loosen who recommended admission to [facility]ad hospitalist. During my interview patient was alert and oriented x3. In no discomfort. He does report some vague abdominal discomfort. He states this is not new. He states he's had this intermittently for almost a year. History provided solely by the patient.  1.  Altered Mental Status.  Initially the patient was confused an hypothermic (94.7).   CT of the head was completed and reviewed by Dr. Venetia Maxon of Neurosurgery.  As mentioned above Dr. Venetia Maxon did not feel any emergent intervention was necessary he also did not feel that his VP shunt was in any way causing altered mental status.  Mr. Axtman' altered mental status resolved in the  emergency department and has not reoccurred during this admission.  2.  Nausea and Vomiting.  Mr. Villalpando had nausea and vomiting prior to admission this was felt possibly to have been viral gastroenteritis. However his lipase was elevated. CT Abdomen, did not show any gall stones, patient has no ETOH history. Electrolytes are within normal limits. Currently his diet is at an advanced he's tolerating full liquids well. He'll receive a heart healthy meal prior to discharge.  3.  Left lower lobe lung infiltrate on chest x-ray. Mr. Romack was initially diagnosed with community acquired pneumonia. He was placed  on IV antibiotics. These were discontinued after 24 hours. The patient was afebrile he did not have an elevated white count or symptoms of pneumonia.  He does have a history of COPD. Consequently, we would ask that an outpatient chest x-ray be ordered in 4-6 weeks to ensure the infiltrate has cleared.  4.  Bradycardia. On admission EKG the patient's pulse rate was 54 with a prolonged QT interval. He was noted during admission to have pulse rates occasionally in the low 40s. It was felt this could be contributing to his intermittent dizziness episodes.  His blood pressure was on the low side of normal. Consequently, Plendil is being discontinued. Dr. Drue Novel will reevaluate on Wednesday in his office and determine appropriate next steps.  5.  Tobacco abuse.  The patient was counseled to stop smoking, but politely refuses.  6.  Acute on Chronic renal failure CKD stage 3.  Elevated creatinine felt to be secondary to vomiting.  Lasix was held.  Creatinine has normalized and patient is now back to baseline.    7.  B/L renal masses/complex cysts. These were seen on CT scan noted below.  Patient had ARF and after one contrast study in the hospital it was felt prudent to hold off on any further contrast imaging of the kidneys.  Further work up to be done as outpatient-discussed with daughter at bedside.  8.  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 Venetia Maxon in 1997. Shunt replaced 06-2008.  As mentioned above, -CT head done at [facility] revealed interval increase in the cystic pineal lesion. However, there was decompression of the ventricular system. Per ER doctor [facility] this was discussed with neurosurgery Dr. Venetia Maxon, who stated, that as the ventricles were well decompressed and patient had another explanation for altered mental status neurosurgery felt comfortable that no emergent intervention was needed-this issue appears stable, now back to usual mental status   Physical Exam on  Discharge: General: Alert, awake, oriented x3, in no acute distress. HEENT: No bruits, no goiter. Heart: bradycardic with regular rhythm, without murmurs, rubs, gallops. Lungs: Clear to auscultation bilaterally. Abdomen: Soft, nontender, nondistended, positive bowel sounds. Extremities: No clubbing cyanosis or edema with positive pedal pulses. Neuro: Grossly intact, nonfocal.  Filed Vitals:   07/05/11 0614 07/05/11 1400 07/05/11 2200 07/06/11 0600  BP: 99/64 117/68 117/69 95/61  Pulse: 68 49 51 43  Temp: 98.6 F (37 C) 98.4 F (36.9 C) 98.2 F (36.8 C) 98.2 F (36.8 C)  TempSrc: Oral Oral Oral Oral  Resp: 18 18 14 16   Height:      Weight:      SpO2: 98% 95% 93% 95%     Intake/Output Summary (Last 24 hours) at 07/06/11 1045 Last data filed at 07/06/11 0900  Gross per 24 hour  Intake    720 ml  Output    300 ml  Net    420 ml    Basic Metabolic Panel:  Lab 07/06/11 1610 07/05/11 0650  NA 141 142  K 3.7 3.6  CL 110 110  CO2 22 25  GLUCOSE 79 81  BUN 14 14  CREATININE 1.84* 1.81*  CALCIUM 8.6 8.3*  MG -- --  PHOS -- --   Liver Function Tests:  Lab 07/03/11 2231  AST 12  ALT 9  ALKPHOS 109  BILITOT 0.3  PROT 6.9  ALBUMIN 4.0    Lab 07/06/11 0632 07/04/11 1800  LIPASE 33 195*  AMYLASE -- --    Lab 07/03/11 2231  AMMONIA 22   CBC:  Lab 07/05/11 0650 07/04/11 0515 07/03/11 2231  WBC 5.4 6.3 --  NEUTROABS -- -- 6.8  HGB 11.2* 10.8* --  HCT 34.1* 32.7* --  MCV 85.7 84.7 --  PLT 193 185 --   Cardiac Enzymes:  Lab 07/03/11 2231  CKTOTAL --  CKMB --  CKMBINDEX --  TROPONINI <0.30   CBG:  Lab 07/04/11 0408  GLUCAP 132*   Coagulation:  Lab 07/03/11 2231  LABPROT 12.4  INR 0.91   Urine Drug Screen: Drugs of Abuse     Component Value Date/Time   LABOPIA NONE DETECTED 10/27/2006 1924   COCAINSCRNUR NONE DETECTED 10/27/2006 1924   LABBENZ NONE DETECTED 10/27/2006 1924   AMPHETMU NONE DETECTED 10/27/2006 1924   THCU NONE DETECTED  10/27/2006 1924   LABBARB  Value: NONE DETECTED        DRUG SCREEN FOR MEDICAL PURPOSES ONLY.  IF CONFIRMATION IS NEEDED FOR ANY PURPOSE, NOTIFY LAB WITHIN 5 DAYS. 10/27/2006 1924      Significant Diagnostic Studies:  Ct Abdomen Pelvis Wo Contrast  07/05/2011  *RADIOLOGY REPORT*  Clinical Data: Nausea.  Clinical concern for pancreatitis.  Renal insufficiency.  CT ABDOMEN AND PELVIS WITHOUT CONTRAST  Technique:  Multidetector CT imaging of the abdomen and pelvis was performed following the standard protocol without intravenous contrast.  Comparison: Renal ultrasound dated 12/03/2005.  Findings: The examination was performed without intravenous or oral contrast per request.  Bilateral ventricular peritoneal shunt tubes are demonstrated with no surrounding fluid collections.  Multiple small liver cysts are demonstrated.  The pancreas has a normal appearance with no peripancreatic soft tissue stranding.  There is a possible 6 mm exophytic mass arising from the posterior aspect of the mid right kidney.  There is also a 1.1 cm exophytic mass arising from the posterior aspect of the mid left kidney, measuring 42 HU in density.  There was a 2.4 cm cyst in the left kidney on the previous ultrasound.  This may be anteriorly located on image number 29, measuring 1.8 cm in maximum diameter.  This measures between 6 and 25 HU in density.  Unremarkable noncontrasted appearance of the spleen, gallbladder, adrenal glands, urinary bladder and prostate gland.  Small amount of linear atelectasis or scarring at both posterior lung bases. Minimal lumbar and lower thoracic spine degenerative changes. Small number of sigmoid colon diverticula without evidence of diverticulitis.  No gastric small bowel abnormalities.  Normal appearing appendix.  IMPRESSION:  1.  Small bilateral renal masses or complex cysts.  If the patient's renal function returns to normal, further evaluation with pre and postcontrast magnetic resonance imaging of  the kidneys would be recommended on an outpatient basis.  Otherwise, renal ultrasound may be helpful. 2.  Multiple small liver cysts. 3.  No evidence of pancreatitis. 4.  Minimal sigmoid diverticulosis.  Original Report Authenticated By:  Darrol Angel, M.D.   Dg Chest 2 View  07/03/2011  *RADIOLOGY REPORT*  Clinical Data: Dizziness  CHEST - 2 VIEW  Comparison: 01/02/2011  Findings: Normal heart size. Consolidation is present in the retrocardiac left base.  VP shunt catheters over the right and left chest are intact.  No pneumothorax.  IMPRESSION: Left lower lobe airspace disease.  Original Report Authenticated By: Donavan Burnet, M.D.   Ct Head Wo Contrast  07/03/2011  *RADIOLOGY REPORT*  Clinical Data: Dizziness  CT HEAD WITHOUT CONTRAST  Technique:  Contiguous axial images were obtained from the base of the skull through the vertex without contrast.  Comparison: None.  Findings: Several ventricular peritoneal shunt catheters are in place and not significantly changed.  The cystic lesion in the region of the pineal gland has increased in size.  Today it measures 3.2 x 1.7 cm.  Previously measured only 2.3 cm in greatest diameter.  Ventricles system remains decompressed.  No acute intracranial hemorrhage.  No midline shift.  Mastoid air cells are clear.  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 Stable VP shunt catheters with decompression of the ventricular system.  Original Report Authenticated By: Donavan Burnet, M.D.      Disposition and Follow-up: Stable for discharge to home in the care of his daughter.    We request Dr. Drue Novel follow up on imaging of his renal cysts as well as his bradycardia.    Discharge Orders    Future Appointments: Provider: Department: Dept Phone: Center:   07/08/2011 11:15 AM Wanda Plump, MD [facility] [phone] [facility]   07/31/2011 2:00 PM [facility] A [facility] [phone] [facility]     Future Orders Please Complete By  Expires   Diet - low sodium heart healthy      Increase activity slowly        Follow-up Information    Follow up with Willow Ora, MD on 07/08/2011. (11:15 am)    Contact information:   [address] [place] [address] [phone]           Time spent on Discharge: 40 min.  SignedStephani Police 07/06/2011, 10:45 AM [phone]  Attending I agree with the assessment and plan as outlined above.  Dr Windell Norfolk

## 2011-07-06 NOTE — Progress Notes (Signed)
   CARE MANAGEMENT NOTE 07/06/2011  Patient:  Zachary Fields, Zachary Fields   Account Number:  [id number]  Date Initiated:  07/06/2011  Documentation initiated by:  Letha Cape  Subjective/Objective Assessment:   dx pna  admit- lives with spouse.  pta independent.     Action/Plan:   Anticipated DC Date:  07/06/2011   Anticipated DC Plan:  HOME/SELF CARE      DC Planning Services  CM consult      Choice offered to / List presented to:             Status of service:  Completed, signed off Medicare Important Message given?   (If response is "NO", the following Medicare IM given date fields will be blank) Date Medicare IM given:   Date Additional Medicare IM given:    Discharge Disposition:  HOME/SELF CARE  Per UR Regulation:    If discussed at Long Length of Stay Meetings, dates discussed:    Comments:  07/06/11 15:11 Letha Cape RN, BSN [phone] patient lives with spouse, pta independent.  No needs identified.

## 2011-07-06 NOTE — Discharge Instructions (Signed)
It is very important for you to stop smoking.  You were found to have a slow heart rate (43 - 68 range).  That could contribute to symptoms of dizziness.  Plendil has been discontinued.    You have multiple kidney cysts that will require outpatient imaging follow up.  Please discuss your pulse rate and Kidney cysts with Dr. Drue Novel on Wednesday.

## 2011-07-08 ENCOUNTER — Encounter: Payer: Self-pay | Admitting: Internal Medicine

## 2011-07-08 ENCOUNTER — Telehealth: Payer: Self-pay | Admitting: *Deleted

## 2011-07-08 ENCOUNTER — Ambulatory Visit (INDEPENDENT_AMBULATORY_CARE_PROVIDER_SITE_OTHER): Payer: Medicare Other | Admitting: Internal Medicine

## 2011-07-08 VITALS — BP 140/88 | HR 61 | Temp 98.0°F | Wt 161.0 lb

## 2011-07-08 DIAGNOSIS — N281 Cyst of kidney, acquired: Secondary | ICD-10-CM

## 2011-07-08 DIAGNOSIS — R42 Dizziness and giddiness: Secondary | ICD-10-CM

## 2011-07-08 DIAGNOSIS — R1110 Vomiting, unspecified: Secondary | ICD-10-CM

## 2011-07-08 DIAGNOSIS — I10 Essential (primary) hypertension: Secondary | ICD-10-CM

## 2011-07-08 DIAGNOSIS — J189 Pneumonia, unspecified organism: Secondary | ICD-10-CM

## 2011-07-08 DIAGNOSIS — N259 Disorder resulting from impaired renal tubular function, unspecified: Secondary | ICD-10-CM

## 2011-07-08 DIAGNOSIS — D508 Other iron deficiency anemias: Secondary | ICD-10-CM

## 2011-07-08 LAB — CBC WITH DIFFERENTIAL/PLATELET
Basophils Absolute: 0 10*3/uL (ref 0.0–0.1)
Eosinophils Absolute: 0.1 10*3/uL (ref 0.0–0.7)
Lymphocytes Relative: 26.7 % (ref 12.0–46.0)
MCHC: 33.6 g/dL (ref 30.0–36.0)
MCV: 86.3 fl (ref 78.0–100.0)
Monocytes Absolute: 0.3 10*3/uL (ref 0.1–1.0)
Neutrophils Relative %: 64.1 % (ref 43.0–77.0)
Platelets: 206 10*3/uL (ref 150.0–400.0)
RDW: 14.6 % (ref 11.5–14.6)

## 2011-07-08 LAB — AMYLASE: Amylase: 77 U/L (ref 27–131)

## 2011-07-08 LAB — LIPASE: Lipase: 100 U/L — ABNORMAL HIGH (ref 11.0–59.0)

## 2011-07-08 MED ORDER — MOXIFLOXACIN HCL 400 MG PO TABS: 400.0000 mg | ORAL_TABLET | Freq: Every day | ORAL | Status: AC

## 2011-07-08 MED ORDER — NIFEDIPINE ER OSMOTIC RELEASE 30 MG PO TB24: 30.0000 mg | ORAL_TABLET | Freq: Every day | ORAL | Status: DC

## 2011-07-08 NOTE — Assessment & Plan Note (Signed)
Last creatinine back to baseline

## 2011-07-08 NOTE — Assessment & Plan Note (Signed)
Has ill defined nausea and abdominal pain, lipase was elevated . Exam today is benign. Recent CT of the abdomen show no pancreatitis. Plan: Repeat lipase and amylase

## 2011-07-08 NOTE — Assessment & Plan Note (Signed)
Last hemoglobin at the hospital decreased. Check a CBC

## 2011-07-08 NOTE — Patient Instructions (Signed)
Start Procardia once a day for BP control take antibiotics, Avelox, for 5 days Call if the nausea or stomach pain get worse Do not drive Come back in one month

## 2011-07-08 NOTE — Progress Notes (Signed)
  Subjective:    Patient ID: Zachary Fields, male    DOB: [birth year], 66 y.o.   MRN: [id number]  HPI Hospital followup Admitted 07/02/2013---->  he had a sudden onset of sweats, hypothermia, increased dizziness that previously was not severe, + N-V; chart reviewed: Had mental status changes, symptoms quickly resolved, neurosurgery was consulted, symptoms felt not to be due  to VP shunt malfunction. He was noted to have bradycardia, pulse in the 40s at times, Plendil was discontinued. Was diagnosed with community-acquired pneumonia, had IV antibiotics, not d/c on po abx.  Had nausea, ill-defined abdominal pain, lipase was elevated, a CT of the abdomen showed a complex renal cysts-- followup needed-- but no evidence of pancreatitis.  Past Medical History: Hypertension CRI Anemia  DEAF L ear, poor hearin R Renal cysts Depression   Hyperhomocysteinemia.  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 Venetia Maxon in 1997. Shunt replaced 06-2008. 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 Venetia Maxon in 1997. Shunt replaced 06-2008.   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   Review of Systems Since he left the hospital he has been "okay". Denies fever, chills or cough. He is still very weak, has mild nausea and mild abdominal pain. No vomiting. He is still very dizzy, mostly when he stands up but sometimes even at rest. Denies any headaches, he has chronic diplopia which has not changed.       Objective:   Physical Exam  General -- alert, well-developed, no overweight appearing. Looks quite weak but in no distress Lungs -- normal respiratory effort, no intercostal retractions, no accessory muscle use; breath sounds slightly decreased at the left base otherwise clear Heart-- normal rate, regular rhythm, no murmur, and no gallop.   Abdomen--soft,  Very mild diffuse tenderness tender, no distention, no masses, no HSM, no guarding, and no rigidity.   Extremities-- no pretibial edema bilaterally  Neurologic-- alert & oriented X3. Gait is quite difficult Psych-- Alert and cooperative; not anxious appearing and not depressed appearing.       Assessment & Plan:

## 2011-07-08 NOTE — Assessment & Plan Note (Signed)
Plendil was discontinued in the hospital due to bradycardia. BP slightly elevated today. Plan: Continue with Lasix Procardia.

## 2011-07-08 NOTE — Assessment & Plan Note (Signed)
Well know history of a right renal cyst,   CT few days ago however showed Small bilateral renal masses or complex cysts. Plan: u/s , avoid MRI with contrast due to CRI

## 2011-07-08 NOTE — Assessment & Plan Note (Signed)
Chronic dizziness and a very difficult gait. This is my main concern, he is high risk for falls Plan: Strongly recommend patient not to drive PT Refer to Dr. Sandria Manly , the patient's neurologist and Dr. Venetia Maxon.

## 2011-07-08 NOTE — Assessment & Plan Note (Signed)
Prescribe Avelox for 5 days, chest x-ray on return to the office

## 2011-07-08 NOTE — Telephone Encounter (Signed)
[facility] Triage Call Report Triage Record Num: [id number] Operator: Ether Griffins Patient Name: Zachary Fields Call Date & Time: 07/07/2011 6:32:54PM Patient Phone: [phone] PCP: Nolon Rod. Paz Patient Gender: Male PCP Fax : Patient DOB: [birth year] Practice Name: [facility] Reason for Call: Caller: Cindy/Other; PCP: Willow Ora; CB#: [phone]; Calling about dizziness, nausea, sli abd pain.Onset 07/07/11.D/C from hospital 07/06/11 for pneumonia.Hx of vertigo,gave Meclizine but has not had time to kick in.This is like a typical vertigo episode for him with nausea.All emergent sxs of Nausea or Vomiting r/o.Home care advice given. Protocol(s) Used: Nausea or Vomiting Recommended Outcome per Protocol: Provide Home/Self Care Reason for Outcome: All other situations Care Advice: ~ SYMPTOM / CONDITION MANAGEMENT ~ CAUTIONS Nausea Care Advice: - Drink small amounts of clear, sweetened liquids or ice cold drinks. - Eat light, bland foods such as saltine crackers or plain bread. - Do not eat high fat, highly seasoned, high fiber, or high sugar content foods. - Avoid mixing hot food and cold foods. - Eat smaller, more frequent meals. - Rest as much as possible in a sitting or in a propped lying position. Do not lie flat for at least 2 hours after eating. - Do not take pain medication (such as aspirin, NSAIDs) while nauseated. - Rest as much as possible until symptoms improve since activity may worsen nausea. ~ 07/07/2011 6:49:23PM

## 2011-07-10 ENCOUNTER — Other Ambulatory Visit: Payer: Self-pay | Admitting: Internal Medicine

## 2011-07-10 ENCOUNTER — Inpatient Hospital Stay: Admission: RE | Admit: 2011-07-10 | Payer: Medicare Other | Source: Ambulatory Visit

## 2011-07-10 DIAGNOSIS — R269 Unspecified abnormalities of gait and mobility: Secondary | ICD-10-CM

## 2011-07-10 LAB — CULTURE, BLOOD (ROUTINE X 2)
Culture  Setup Time: 201304270613
Culture: NO GROWTH

## 2011-07-12 ENCOUNTER — Other Ambulatory Visit: Payer: Self-pay | Admitting: Internal Medicine

## 2011-07-13 NOTE — Telephone Encounter (Signed)
Refill done.  

## 2011-07-14 ENCOUNTER — Ambulatory Visit
Admission: RE | Admit: 2011-07-14 | Discharge: 2011-07-14 | Disposition: A | Discharge status: Home / Self Care | Payer: Medicare Other | Source: Ambulatory Visit | Attending: Internal Medicine | Admitting: Internal Medicine

## 2011-07-14 DIAGNOSIS — N281 Cyst of kidney, acquired: Secondary | ICD-10-CM

## 2011-07-16 ENCOUNTER — Telehealth: Payer: Self-pay | Admitting: Internal Medicine

## 2011-07-16 DIAGNOSIS — N281 Cyst of kidney, acquired: Secondary | ICD-10-CM

## 2011-07-16 NOTE — Telephone Encounter (Signed)
Advise patient: The ultrasound could not  Assess well  the area. The radiologist recommended a future CT or a MRI without contrast material. My recommendation is to do a CT in 3-4 months. JP ---------------- 1. The area questioned in the anterior left kidney is not well  seen by ultrasound and is the more worrisome of the areas  questioned on recent CT. If the patient cannot have IV contrast  media then follow-up with unenhanced MRI or perhaps unenhanced CT  may be helpful as follow-up to assess stability.  2. Echogenic kidneys consistent with chronic renal medical disease.  3. Single renal cysts bilaterally.

## 2011-07-16 NOTE — Telephone Encounter (Signed)
Please advise 

## 2011-07-16 NOTE — Telephone Encounter (Signed)
Discussed with pt. He would like to go ahead & schedule a future appointment. Do I need to put the orders in?

## 2011-07-16 NOTE — Telephone Encounter (Signed)
Patient is inquiring about the results of his recent 07-14-11, renal ultrasound.  Please call as soon as possible per patient's request.

## 2011-07-16 NOTE — Telephone Encounter (Signed)
Renal CT without contrast--- DX renal cysts To be done in 3 months

## 2011-07-17 ENCOUNTER — Ambulatory Visit: Payer: Medicare Other | Attending: Internal Medicine | Admitting: Physical Therapy

## 2011-07-17 DIAGNOSIS — R269 Unspecified abnormalities of gait and mobility: Secondary | ICD-10-CM | POA: Insufficient documentation

## 2011-07-17 DIAGNOSIS — IMO0001 Reserved for inherently not codable concepts without codable children: Secondary | ICD-10-CM | POA: Insufficient documentation

## 2011-07-18 ENCOUNTER — Other Ambulatory Visit: Payer: Self-pay | Admitting: Internal Medicine

## 2011-07-20 NOTE — Telephone Encounter (Signed)
Refill done.  

## 2011-07-20 NOTE — Telephone Encounter (Signed)
Discussed with pt's daughter. Entered CT.

## 2011-07-24 ENCOUNTER — Ambulatory Visit: Payer: Medicare Other | Admitting: Physical Therapy

## 2011-07-27 ENCOUNTER — Telehealth: Payer: Self-pay | Admitting: Internal Medicine

## 2011-07-27 MED ORDER — CYANOCOBALAMIN 1000 MCG/ML IJ SOLN: 1000.0000 ug | INTRAMUSCULAR | Status: DC

## 2011-07-27 NOTE — Telephone Encounter (Signed)
Done

## 2011-07-27 NOTE — Telephone Encounter (Signed)
Refill:Cyanocobalamin 1000 mcg/ml injection. Inject 1 ml into the muscle once. Qty 10. *Need frequency please-how often?*

## 2011-07-29 ENCOUNTER — Other Ambulatory Visit: Payer: Self-pay | Admitting: Neurosurgery

## 2011-07-29 DIAGNOSIS — D496 Neoplasm of unspecified behavior of brain: Secondary | ICD-10-CM

## 2011-07-29 DIAGNOSIS — T85695A Other mechanical complication of other nervous system device, implant or graft, initial encounter: Secondary | ICD-10-CM

## 2011-07-31 ENCOUNTER — Ambulatory Visit: Payer: Medicare Other | Admitting: Physical Therapy

## 2011-07-31 ENCOUNTER — Ambulatory Visit (INDEPENDENT_AMBULATORY_CARE_PROVIDER_SITE_OTHER): Payer: Medicare Other

## 2011-07-31 DIAGNOSIS — D518 Other vitamin B12 deficiency anemias: Secondary | ICD-10-CM

## 2011-07-31 DIAGNOSIS — D519 Vitamin B12 deficiency anemia, unspecified: Secondary | ICD-10-CM

## 2011-07-31 MED ORDER — CYANOCOBALAMIN 1000 MCG/ML IJ SOLN
1000.0000 ug | Freq: Once | INTRAMUSCULAR | Status: AC
  Administered 2011-07-31: 1000 ug via INTRAMUSCULAR

## 2011-08-06 ENCOUNTER — Other Ambulatory Visit: Payer: Medicare Other

## 2011-08-07 ENCOUNTER — Ambulatory Visit: Payer: Medicare Other | Admitting: Physical Therapy

## 2011-08-10 ENCOUNTER — Telehealth: Payer: Self-pay | Admitting: Internal Medicine

## 2011-08-10 DIAGNOSIS — N281 Cyst of kidney, acquired: Secondary | ICD-10-CM

## 2011-08-10 NOTE — Telephone Encounter (Signed)
In reference to CT Order to be scheduled in July, recent labs are needed.  Need orders please for a BUN & CREAT, once orders entered I can call & schedule the patient for his lab appointment.

## 2011-08-10 NOTE — Telephone Encounter (Signed)
See phone note from 07/16/2011: Needs a Renal CT without contrast--- DX renal cysts  To be done ~ 10-2011, no need for BUN or creatinine

## 2011-08-10 NOTE — Telephone Encounter (Signed)
i will enter the orders but what dx code do i need to use?

## 2011-08-12 ENCOUNTER — Ambulatory Visit
Admission: RE | Admit: 2011-08-12 | Discharge: 2011-08-12 | Disposition: A | Discharge status: Home / Self Care | Payer: Medicare Other | Source: Ambulatory Visit | Attending: Neurosurgery | Admitting: Neurosurgery

## 2011-08-12 ENCOUNTER — Other Ambulatory Visit: Payer: Self-pay | Admitting: Diagnostic Radiology

## 2011-08-12 ENCOUNTER — Telehealth: Payer: Self-pay | Admitting: Internal Medicine

## 2011-08-12 DIAGNOSIS — R42 Dizziness and giddiness: Secondary | ICD-10-CM

## 2011-08-12 DIAGNOSIS — N281 Cyst of kidney, acquired: Secondary | ICD-10-CM

## 2011-08-12 DIAGNOSIS — D496 Neoplasm of unspecified behavior of brain: Secondary | ICD-10-CM

## 2011-08-12 DIAGNOSIS — T85695A Other mechanical complication of other nervous system device, implant or graft, initial encounter: Secondary | ICD-10-CM

## 2011-08-12 MED ORDER — GADOBENATE DIMEGLUMINE 529 MG/ML IV SOLN
7.0000 mL | Freq: Once | INTRAVENOUS | Status: AC | PRN
  Administered 2011-08-12: 7 mL via INTRAVENOUS

## 2011-08-13 MED ORDER — MECLIZINE HCL 12.5 MG PO TABS: 12.5000 mg | ORAL_TABLET | Freq: Three times a day (TID) | ORAL | Status: AC | PRN

## 2011-08-13 NOTE — Telephone Encounter (Signed)
Spoke with the patient's daughter Arline Asp, he went to see Dr. Venetia Maxon his neurosurgeon and found that he recently had a stroke. Also has developed the cyst in the brain and they need to operate on him and change his ventricular catheter. Main concern is ongoing dizziness and nausea. He was recently referred to GI due to the nausea and increased amylase, the patient declined to go. After we talked we agreed to see him back to neurology due to the chronic dizziness. Call refill Antivert as well.

## 2011-08-14 ENCOUNTER — Other Ambulatory Visit: Payer: Medicare Other

## 2011-08-14 ENCOUNTER — Ambulatory Visit: Payer: Medicare Other | Admitting: Physical Therapy

## 2011-08-14 NOTE — Telephone Encounter (Signed)
Addended by: Edwena Felty T on: 08/14/2011 01:00 PM   Modules accepted: Orders

## 2011-08-14 NOTE — Telephone Encounter (Signed)
Order done

## 2011-08-31 ENCOUNTER — Ambulatory Visit: Payer: Medicare Other

## 2011-09-04 ENCOUNTER — Ambulatory Visit (INDEPENDENT_AMBULATORY_CARE_PROVIDER_SITE_OTHER): Payer: Medicare Other

## 2011-09-04 DIAGNOSIS — E538 Deficiency of other specified B group vitamins: Secondary | ICD-10-CM

## 2011-09-04 MED ORDER — CYANOCOBALAMIN 1000 MCG/ML IJ SOLN
1000.0000 ug | Freq: Once | INTRAMUSCULAR | Status: AC
  Administered 2011-09-04: 1000 ug via INTRAMUSCULAR

## 2011-10-02 ENCOUNTER — Ambulatory Visit (INDEPENDENT_AMBULATORY_CARE_PROVIDER_SITE_OTHER): Payer: Medicare Other | Admitting: *Deleted

## 2011-10-02 DIAGNOSIS — E538 Deficiency of other specified B group vitamins: Secondary | ICD-10-CM

## 2011-10-02 MED ORDER — CYANOCOBALAMIN 1000 MCG/ML IJ SOLN
1000.0000 ug | Freq: Once | INTRAMUSCULAR | Status: AC
  Administered 2011-10-02: 1000 ug via INTRAMUSCULAR

## 2011-10-28 ENCOUNTER — Other Ambulatory Visit: Payer: Medicare Other

## 2011-10-30 ENCOUNTER — Ambulatory Visit (INDEPENDENT_AMBULATORY_CARE_PROVIDER_SITE_OTHER): Payer: Medicare Other | Admitting: *Deleted

## 2011-10-30 DIAGNOSIS — E538 Deficiency of other specified B group vitamins: Secondary | ICD-10-CM

## 2011-10-30 MED ORDER — CYANOCOBALAMIN 1000 MCG/ML IJ SOLN
1000.0000 ug | Freq: Once | INTRAMUSCULAR | Status: AC
  Administered 2011-10-30: 1000 ug via INTRAMUSCULAR

## 2011-11-15 ENCOUNTER — Other Ambulatory Visit: Payer: Self-pay | Admitting: Internal Medicine

## 2011-11-16 NOTE — Telephone Encounter (Signed)
Refill done.  

## 2011-11-24 ENCOUNTER — Ambulatory Visit (INDEPENDENT_AMBULATORY_CARE_PROVIDER_SITE_OTHER): Payer: Medicare Other | Admitting: Internal Medicine

## 2011-11-24 VITALS — BP 142/86 | HR 77 | Temp 99.0°F | Wt 167.0 lb

## 2011-11-24 DIAGNOSIS — R413 Other amnesia: Secondary | ICD-10-CM

## 2011-11-24 DIAGNOSIS — I6350 Cerebral infarction due to unspecified occlusion or stenosis of unspecified cerebral artery: Secondary | ICD-10-CM

## 2011-11-24 DIAGNOSIS — D369 Benign neoplasm, unspecified site: Secondary | ICD-10-CM

## 2011-11-24 DIAGNOSIS — J40 Bronchitis, not specified as acute or chronic: Secondary | ICD-10-CM | POA: Insufficient documentation

## 2011-11-24 DIAGNOSIS — I10 Essential (primary) hypertension: Secondary | ICD-10-CM

## 2011-11-24 DIAGNOSIS — N281 Cyst of kidney, acquired: Secondary | ICD-10-CM

## 2011-11-24 DIAGNOSIS — F17200 Nicotine dependence, unspecified, uncomplicated: Secondary | ICD-10-CM

## 2011-11-24 MED ORDER — AZITHROMYCIN 250 MG PO TABS: ORAL_TABLET | ORAL | Status: DC

## 2011-11-24 NOTE — Patient Instructions (Addendum)
Please get your x-ray at the other [facility]  office located at: [address] [place], across from [facility].  Please go to the basement, this is a walk-in facility, they are open from 8:30 to 5:30 PM. Phone number [phone]. ----- Rest, fluids , tylenol For cough, take Mucinex DM twice a day as needed  Take the antibiotic as prescribed  (zithromax) Call if no better in few days Call anytime if the symptoms are severe ----- Please come back with your daughter for a checkup in a couple of months Get a flu shot once you feel better

## 2011-11-24 NOTE — Assessment & Plan Note (Signed)
Ongoing issue, counseled 

## 2011-11-24 NOTE — Assessment & Plan Note (Addendum)
Presents with mild bronchitis, had pneumonia a few months ago, will do a chest x-ray, see instructions

## 2011-11-24 NOTE — Progress Notes (Signed)
  Subjective:    Patient ID: Zachary Fields, male    DOB: [birth year], 66 y.o.   MRN: [id number]  HPI Acute visit 2 days history of cough, runny nose and mild sneezing. Cough is productive, very small amount of sputum, cough or?Marland Kitchen Since he is here at the office, I review his chart, he recently saw Dr. Venetia Maxon, dx w/ a stroke with no apparent symptoms. He is also due for a evaluation of the kidney cysts. Medication list is reviewed, he self discontinue Aricept  Continue to smoke as before  Past Medical History: Hypertension CRI Anemia  DEAF L ear, poor hearin R Renal cysts Depression    Hyperhomocysteinemia.   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 Venetia Maxon in 1997. Shunt replaced 06-2008. 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 Venetia Maxon in 1997. Shunt replaced 06-2008.   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   Review of Systems No fever or chills Denies shortness of breath, wheezing. He does have some chest congestion with cough. Denies any sinus pain     Objective:   Physical Exam  General -- alert, well-developed, and well-nourished.   Neck--  normal carotid pulses, no bruit HEENT -- TMs normal, throat w/o redness, face symmetric and not tender to palpation, nose congested, no discharge Lungs -- few rhonchi with cough, no wheezing, no increased work of breathing.  Heart-- normal rate, regular rhythm, no murmur, and no  gallop.   Extremities-- no pretibial edema bilaterally Psych-- Cognition and judgment appear intact. Alert and cooperative with normal attention span and concentration.  not anxious appearing and not depressed appearing.       Assessment & Plan:  today , I spent more than  min with the patient, >50% of the time counseling, and   reviewing the chart

## 2011-11-24 NOTE — Assessment & Plan Note (Addendum)
Last evaluation was via a renal ultrasound, and they recommended that noncontrast CT or MRI. We'll discuss w/ radiology

## 2011-11-24 NOTE — Assessment & Plan Note (Signed)
Patient is not taking Aricept

## 2011-11-24 NOTE — Assessment & Plan Note (Addendum)
History of a small stroke by MRI 08/2011, apparently he was asymptomatic. Continue with aspirin Last cholesterol normal Need to control his BP the best we can, restart Lasix Ongoing tobacco abuse, strongly recommend to discontinue. Had a workup including echocardiogram and carotid ultrasound which were normal in 2008. Consider repeat w/u

## 2011-11-24 NOTE — Assessment & Plan Note (Signed)
Not taking Lasix, recommend to restart

## 2011-11-24 NOTE — Assessment & Plan Note (Signed)
Recently seen by Dr. Venetia Maxon, chart reviewed, was recommended possibly surgery after the MRI of the brain was done however at this point they're holding the  procedure

## 2011-11-25 ENCOUNTER — Ambulatory Visit (INDEPENDENT_AMBULATORY_CARE_PROVIDER_SITE_OTHER)
Admission: RE | Admit: 2011-11-25 | Discharge: 2011-11-25 | Disposition: A | Discharge status: Home / Self Care | Payer: Medicare Other | Source: Ambulatory Visit | Attending: Internal Medicine | Admitting: Internal Medicine

## 2011-11-25 DIAGNOSIS — J40 Bronchitis, not specified as acute or chronic: Secondary | ICD-10-CM

## 2011-12-02 ENCOUNTER — Encounter: Payer: Self-pay | Admitting: Internal Medicine

## 2011-12-02 ENCOUNTER — Telehealth: Payer: Self-pay | Admitting: Internal Medicine

## 2011-12-02 NOTE — Telephone Encounter (Signed)
Spoke with the patient's daughter, reportedly patient is doing a little better but is still coughing. He still smoking. Plan:  Start Symbicort twice a day, will provide samples Office visit if not improving ------ In reference to the abnormal CT and ultrasound of the kidney, I left a message for the radiologist.

## 2011-12-04 ENCOUNTER — Ambulatory Visit: Payer: Medicare Other

## 2011-12-07 ENCOUNTER — Ambulatory Visit (INDEPENDENT_AMBULATORY_CARE_PROVIDER_SITE_OTHER): Payer: Medicare Other | Admitting: *Deleted

## 2011-12-07 DIAGNOSIS — E538 Deficiency of other specified B group vitamins: Secondary | ICD-10-CM

## 2011-12-07 MED ORDER — CYANOCOBALAMIN 1000 MCG/ML IJ SOLN
1000.0000 ug | Freq: Once | INTRAMUSCULAR | Status: AC
  Administered 2011-12-07: 1000 ug via INTRAMUSCULAR

## 2012-01-07 ENCOUNTER — Encounter: Payer: Self-pay | Admitting: Internal Medicine

## 2012-01-07 ENCOUNTER — Other Ambulatory Visit: Payer: Self-pay | Admitting: Internal Medicine

## 2012-01-07 DIAGNOSIS — N281 Cyst of kidney, acquired: Secondary | ICD-10-CM

## 2012-01-07 NOTE — Telephone Encounter (Signed)
No answer, will get a MRI

## 2012-01-08 ENCOUNTER — Ambulatory Visit (INDEPENDENT_AMBULATORY_CARE_PROVIDER_SITE_OTHER): Payer: Medicare Other

## 2012-01-08 ENCOUNTER — Ambulatory Visit: Payer: Medicare Other

## 2012-01-08 DIAGNOSIS — Z299 Encounter for prophylactic measures, unspecified: Secondary | ICD-10-CM

## 2012-01-08 MED ORDER — CYANOCOBALAMIN 1000 MCG/ML IJ SOLN
1000.0000 ug | Freq: Once | INTRAMUSCULAR | Status: AC
  Administered 2012-01-08: 1000 ug via INTRAMUSCULAR

## 2012-01-15 ENCOUNTER — Other Ambulatory Visit: Payer: Medicare Other

## 2012-01-22 ENCOUNTER — Ambulatory Visit
Admission: RE | Admit: 2012-01-22 | Discharge: 2012-01-22 | Disposition: A | Discharge status: Home / Self Care | Payer: Medicare Other | Source: Ambulatory Visit | Attending: Internal Medicine | Admitting: Internal Medicine

## 2012-01-22 DIAGNOSIS — N281 Cyst of kidney, acquired: Secondary | ICD-10-CM

## 2012-01-25 ENCOUNTER — Encounter: Payer: Self-pay | Admitting: *Deleted

## 2012-02-12 ENCOUNTER — Ambulatory Visit (INDEPENDENT_AMBULATORY_CARE_PROVIDER_SITE_OTHER): Payer: Medicare Other | Admitting: *Deleted

## 2012-02-12 DIAGNOSIS — E538 Deficiency of other specified B group vitamins: Secondary | ICD-10-CM

## 2012-02-12 MED ORDER — CYANOCOBALAMIN 1000 MCG/ML IJ SOLN
1000.0000 ug | Freq: Once | INTRAMUSCULAR | Status: AC
  Administered 2012-02-12: 1000 ug via INTRAMUSCULAR

## 2012-03-11 ENCOUNTER — Ambulatory Visit (INDEPENDENT_AMBULATORY_CARE_PROVIDER_SITE_OTHER): Payer: Medicare Other | Admitting: *Deleted

## 2012-03-11 DIAGNOSIS — E538 Deficiency of other specified B group vitamins: Secondary | ICD-10-CM

## 2012-03-11 MED ORDER — CYANOCOBALAMIN 1000 MCG/ML IJ SOLN: 1000.0000 ug | Freq: Once | INTRAMUSCULAR | Status: DC

## 2012-04-08 ENCOUNTER — Ambulatory Visit (INDEPENDENT_AMBULATORY_CARE_PROVIDER_SITE_OTHER): Payer: Medicare Other | Admitting: *Deleted

## 2012-04-08 DIAGNOSIS — E538 Deficiency of other specified B group vitamins: Secondary | ICD-10-CM

## 2012-04-08 MED ORDER — CYANOCOBALAMIN 1000 MCG/ML IJ SOLN
1000.0000 ug | Freq: Once | INTRAMUSCULAR | Status: AC
  Administered 2012-04-08: 1000 ug via INTRAMUSCULAR

## 2012-05-06 ENCOUNTER — Ambulatory Visit (INDEPENDENT_AMBULATORY_CARE_PROVIDER_SITE_OTHER): Payer: Medicare Other | Admitting: *Deleted

## 2012-05-06 DIAGNOSIS — E538 Deficiency of other specified B group vitamins: Secondary | ICD-10-CM

## 2012-05-06 MED ORDER — CYANOCOBALAMIN 1000 MCG/ML IJ SOLN
1000.0000 ug | Freq: Once | INTRAMUSCULAR | Status: AC
  Administered 2012-05-06: 1000 ug via INTRAMUSCULAR

## 2012-05-26 ENCOUNTER — Telehealth: Payer: Self-pay | Admitting: *Deleted

## 2012-05-26 MED ORDER — FUROSEMIDE 40 MG PO TABS: ORAL_TABLET | ORAL | Status: DC

## 2012-05-26 NOTE — Telephone Encounter (Signed)
Refill done.  

## 2012-06-03 ENCOUNTER — Ambulatory Visit (INDEPENDENT_AMBULATORY_CARE_PROVIDER_SITE_OTHER): Payer: Medicare Other | Admitting: *Deleted

## 2012-06-03 DIAGNOSIS — E538 Deficiency of other specified B group vitamins: Secondary | ICD-10-CM

## 2012-06-03 MED ORDER — CYANOCOBALAMIN 1000 MCG/ML IJ SOLN
1000.0000 ug | Freq: Once | INTRAMUSCULAR | Status: AC
  Administered 2012-06-03: 1000 ug via INTRAMUSCULAR

## 2012-07-01 ENCOUNTER — Ambulatory Visit (INDEPENDENT_AMBULATORY_CARE_PROVIDER_SITE_OTHER): Payer: Medicare Other | Admitting: *Deleted

## 2012-07-01 ENCOUNTER — Telehealth: Payer: Self-pay | Admitting: *Deleted

## 2012-07-01 DIAGNOSIS — E538 Deficiency of other specified B group vitamins: Secondary | ICD-10-CM

## 2012-07-01 MED ORDER — CYANOCOBALAMIN 1000 MCG/ML IJ SOLN: 1000.0000 ug | Freq: Once | INTRAMUSCULAR | Status: DC

## 2012-07-01 MED ORDER — CYANOCOBALAMIN 1000 MCG/ML IJ SOLN
1000.0000 ug | Freq: Once | INTRAMUSCULAR | Status: AC
  Administered 2012-07-01: 1000 ug via INTRAMUSCULAR

## 2012-07-01 NOTE — Telephone Encounter (Signed)
Refill done.  

## 2012-07-26 ENCOUNTER — Telehealth: Payer: Self-pay | Admitting: Internal Medicine

## 2012-07-26 MED ORDER — CYANOCOBALAMIN 1000 MCG/ML IJ SOLN: 1000.0000 ug | INTRAMUSCULAR | Status: DC

## 2012-07-26 NOTE — Telephone Encounter (Deleted)
Please advise ok to send in Rx

## 2012-07-26 NOTE — Telephone Encounter (Signed)
Rx sent 

## 2012-07-26 NOTE — Telephone Encounter (Signed)
Pt called to see if dr Drue Novel could call in b-12 medicine into [facility] on [address]. Pt called [facility] and they have it in. thanks

## 2012-08-19 ENCOUNTER — Encounter: Payer: Self-pay | Admitting: Gastroenterology

## 2012-09-10 ENCOUNTER — Other Ambulatory Visit: Payer: Self-pay | Admitting: Internal Medicine

## 2012-09-12 NOTE — Telephone Encounter (Signed)
Refill done. Pt. Contacted and scheduled for OV.

## 2012-09-21 ENCOUNTER — Encounter: Payer: Self-pay | Admitting: Gastroenterology

## 2012-09-23 ENCOUNTER — Telehealth: Payer: Self-pay | Admitting: *Deleted

## 2012-09-23 ENCOUNTER — Ambulatory Visit (INDEPENDENT_AMBULATORY_CARE_PROVIDER_SITE_OTHER): Payer: Medicare Other | Admitting: Internal Medicine

## 2012-09-23 VITALS — BP 140/80 | HR 98 | Temp 98.5°F | Wt 169.2 lb

## 2012-09-23 DIAGNOSIS — N400 Enlarged prostate without lower urinary tract symptoms: Secondary | ICD-10-CM

## 2012-09-23 DIAGNOSIS — I6350 Cerebral infarction due to unspecified occlusion or stenosis of unspecified cerebral artery: Secondary | ICD-10-CM

## 2012-09-23 DIAGNOSIS — R42 Dizziness and giddiness: Secondary | ICD-10-CM

## 2012-09-23 DIAGNOSIS — J40 Bronchitis, not specified as acute or chronic: Secondary | ICD-10-CM

## 2012-09-23 DIAGNOSIS — I10 Essential (primary) hypertension: Secondary | ICD-10-CM

## 2012-09-23 DIAGNOSIS — N281 Cyst of kidney, acquired: Secondary | ICD-10-CM

## 2012-09-23 DIAGNOSIS — N259 Disorder resulting from impaired renal tubular function, unspecified: Secondary | ICD-10-CM

## 2012-09-23 DIAGNOSIS — J449 Chronic obstructive pulmonary disease, unspecified: Secondary | ICD-10-CM

## 2012-09-23 DIAGNOSIS — F17200 Nicotine dependence, unspecified, uncomplicated: Secondary | ICD-10-CM

## 2012-09-23 DIAGNOSIS — E7210 Disorders of sulfur-bearing amino-acid metabolism, unspecified: Secondary | ICD-10-CM

## 2012-09-23 MED ORDER — TAMSULOSIN HCL 0.4 MG PO CAPS: ORAL_CAPSULE | ORAL | Status: DC

## 2012-09-23 NOTE — Assessment & Plan Note (Signed)
Recheck a BMP 

## 2012-09-23 NOTE — Assessment & Plan Note (Signed)
Ongoing issue, counseled

## 2012-09-23 NOTE — Assessment & Plan Note (Signed)
At some point used Symbicort, denies wheezing, ongoing tobacco abuse

## 2012-09-23 NOTE — Assessment & Plan Note (Signed)
BPs seems well controlled today, no ambulatory BPs. See instructions

## 2012-09-23 NOTE — Assessment & Plan Note (Signed)
MRI stable on 11- 2013, recheck 01-2013

## 2012-09-23 NOTE — Telephone Encounter (Signed)
Refill done.  

## 2012-09-23 NOTE — Assessment & Plan Note (Signed)
Chronic problem, at baseline

## 2012-09-23 NOTE — Progress Notes (Signed)
  Subjective:    Patient ID: Zachary Fields, male    DOB: 09-30-1945, 67 y.o.   MRN: [id number]  HPI Routine office visit, here with his granddaughter. Tobacco abuse, still smokes "at least a pack a day" Hypertension, good compliance with plendil and Lasix, no ambulatory BPs Bronchitis? One-week history of increased from baseline cough with occasional green sputum. BPH, self DC Flomax a while but, symptoms are gradually coming back mostly difficulty urinating.  Past Medical History: Hypertension CRI Anemia  DEAF L ear, poor hearin R Renal cysts Depression    Hyperhomocysteinemia.   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 Venetia Maxon in 1997. Shunt replaced 06-2008. 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 Venetia Maxon in 1997. Shunt replaced 06-2008.   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   Review of Systems No fever or chills No chest pain or shortness or breath No dysuria or gross hematuria.     Objective:   Physical Exam BP 140/80  Pulse 98  Temp(Src) 98.5 F (36.9 C) (Oral)  Wt 169 lb 3.2 oz (76.749 kg)  BMI 21.71 kg/m2  SpO2 97%  General -- alert, well-developed. NAD HEENT -- TMs obscured by wax B, throat w/o redness, face symmetric and not tender to palpation,nose w/o d/c ; not pale  Lungs --  Few rhonchi with cough, no wheezing Heart-- normal rate, regular rhythm, no murmur, and no gallop.   Abdomen--soft,  non-tender, no distention, no masses, no HSM, no guarding, and no rigidity.   Extremities-- no pretibial edema bilaterally  Neurologic-- alert & oriented X3 and strength normal in all extremities. Psych-- Cognition and judgment appear intact. Alert and cooperative with normal attention span and concentration.  not anxious appearing and not depressed appearing.       Assessment & Plan:

## 2012-09-23 NOTE — Assessment & Plan Note (Signed)
On aspirin, check a cholesterol panel.

## 2012-09-23 NOTE — Assessment & Plan Note (Signed)
Off Flomax, symptoms coming back, recommend to restart Flomax

## 2012-09-23 NOTE — Patient Instructions (Addendum)
Come back fasting: FLP, LFTs- dx h/o  CVA BMP --- dx hypertension B12, folic acid-- dx B12 deficiency ---- Check the  blood pressure 2 or 3 times a week, be sure it is between 110/60 and 140/85. If it is consistently higher or lower, let me know -- Rest, fluids , tylenol For cough, take Mucinex DM twice a day as needed   Call if no better in few days Call anytime if the symptoms are severe --- Go back on flomax --- Schedule a physical in 4 months

## 2012-09-23 NOTE — Assessment & Plan Note (Signed)
Mild sx x 1 week, heavy smoker. At some point used symbicort but not anymore Plan: abx if no better, see instructions

## 2012-09-23 NOTE — Assessment & Plan Note (Addendum)
Taking folic acid Takes B12

## 2012-09-24 ENCOUNTER — Encounter: Payer: Self-pay | Admitting: Internal Medicine

## 2012-10-09 ENCOUNTER — Other Ambulatory Visit: Payer: Self-pay | Admitting: Internal Medicine

## 2012-10-10 NOTE — Telephone Encounter (Signed)
Please clarify with the patient: Is he  taking Felodipine or Nifedipine; Okay to refill the one he is actually taking, can not take both

## 2012-10-10 NOTE — Telephone Encounter (Signed)
Please advise if refill is appropriate, appears it may have been d/c on last OV 09/23/12. Not listed on current med list.

## 2012-10-10 NOTE — Telephone Encounter (Signed)
Continue taking what he is taking at the present time, if he notice he needs a refill to call the office directly

## 2012-10-10 NOTE — Telephone Encounter (Signed)
Spoke with patient, he was very confused about what medications he was currently taking and was unsure if he really needed a refill on anything. Reviewed current med list in EPIC and pt. States he didn't know what medications he was taking. Pt. Encouraged to schedule office visit with Dr. Drue Novel to discuss medications. Pt. Scheduled for 10/21/12. Please advise next course of action for this refill request. Thanks.

## 2012-10-11 NOTE — Telephone Encounter (Signed)
Spoke with pharmacy, pt. Has not picked up rx since 08/01/12. Reviewed last OV. Appears procardia was d/c on 09/23/12. Not listed on med list. Spoke with patient, made him aware of this information as well as information from Dr. Drue Novel, and reviewed current med list in epic with patient. Requested patient bring rx bottles with him of all medications he is currently taking at next OV 10/21/12. Pt. Verbalized understanding. rx refill denied based on information listed below.

## 2012-10-14 ENCOUNTER — Other Ambulatory Visit: Payer: Self-pay | Admitting: Internal Medicine

## 2012-10-15 ENCOUNTER — Other Ambulatory Visit: Payer: Self-pay | Admitting: Internal Medicine

## 2012-10-17 ENCOUNTER — Telehealth: Payer: Self-pay | Admitting: *Deleted

## 2012-10-17 NOTE — Telephone Encounter (Signed)
See telephone encounter for 10/17/12. Pt. States he no longer takes this medication. Not found on med list.

## 2012-10-17 NOTE — Telephone Encounter (Signed)
See telephone note from 10/17/12. Pt. States he no longer takes this medication

## 2012-10-17 NOTE — Telephone Encounter (Signed)
Spoke with pt to clarify if he is taking Procardia due to the fact that we are getting refill requests from the pts pharmacy. Patient states he does not take Procardia and does have upcoming appt for med mgmt on Friday. Patient advised to bring all pill bottles with him to the appt.

## 2012-10-21 ENCOUNTER — Encounter: Payer: Self-pay | Admitting: Internal Medicine

## 2012-10-21 ENCOUNTER — Ambulatory Visit (INDEPENDENT_AMBULATORY_CARE_PROVIDER_SITE_OTHER): Payer: Medicare Other | Admitting: Internal Medicine

## 2012-10-21 VITALS — BP 130/80 | HR 70 | Temp 98.4°F | Wt 168.8 lb

## 2012-10-21 DIAGNOSIS — I10 Essential (primary) hypertension: Secondary | ICD-10-CM

## 2012-10-21 DIAGNOSIS — R42 Dizziness and giddiness: Secondary | ICD-10-CM

## 2012-10-21 DIAGNOSIS — R748 Abnormal levels of other serum enzymes: Secondary | ICD-10-CM

## 2012-10-21 DIAGNOSIS — E7210 Disorders of sulfur-bearing amino-acid metabolism, unspecified: Secondary | ICD-10-CM

## 2012-10-21 MED ORDER — CYANOCOBALAMIN 1000 MCG/ML IJ SOLN: 1000.0000 ug | INTRAMUSCULAR | Status: DC

## 2012-10-21 MED ORDER — FELODIPINE ER 10 MG PO TB24: 10.0000 mg | ORAL_TABLET | Freq: Every day | ORAL | Status: DC

## 2012-10-21 NOTE — Patient Instructions (Addendum)
Next visit in 3 months for a physical exam Please make an appointment before you leave the office today (or call few weeks in advance)

## 2012-10-21 NOTE — Assessment & Plan Note (Signed)
Occasionally elevated lipase; abdominal CTs, ultrasounds and MRIs from last year reviewed, no evidence of pancreatic disease. Plan is to recheck lipase  from time to time

## 2012-10-21 NOTE — Assessment & Plan Note (Signed)
RF plendil, BP well controlled

## 2012-10-21 NOTE — Assessment & Plan Note (Signed)
Refilled B12 shots, recommend good compliance

## 2012-10-21 NOTE — Assessment & Plan Note (Signed)
Today he reports he is doing great, hardly ever takes Antivert

## 2012-10-21 NOTE — Progress Notes (Signed)
  Subjective:    Patient ID: Zachary Fields, male    DOB: [birth year], 67 y.o.   MRN: [id number]  HPI Here today because he wants to go over his medication list with me, he's not sure if he is taking all medications as prescribed. In general feels well, we went over his medication list, good compliance except for the B12 shot that sometimes he forgets. He does take Plendil and needs a refill which was provided  Past Medical History: Hypertension CRI Anemia  DEAF L ear, poor hearin R Renal cysts Depression    Hyperhomocysteinemia.   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 Venetia Maxon in 1997. Shunt replaced 06-2008. 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 Venetia Maxon in 1997. Shunt replaced 06-2008.   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 No diarrhea. Very seldom has abdominal pain, it last few hours, not associated with other symptoms,located at the mid anterior abdomen    Objective:   Physical Exam BP 130/80  Pulse 70  Temp(Src) 98.4 F (36.9 C)  Wt 168 lb 12.8 oz (76.567 kg)  BMI 21.66 kg/m2  SpO2 97%  General -- alert, well-developed, NAD.  Neck --no mass or  LAD HEENT-- paroti glands not tender or large   Abdomen-- Not distended, Good bowel sounds,soft, non-tender.No mass  Psych-- Cognition and judgment appear intact. Alert and cooperative with normal attention span and concentration. not anxious appearing and not depressed appearing.       Assessment & Plan:

## 2012-10-22 ENCOUNTER — Encounter: Payer: Self-pay | Admitting: Internal Medicine

## 2012-10-24 ENCOUNTER — Ambulatory Visit (INDEPENDENT_AMBULATORY_CARE_PROVIDER_SITE_OTHER): Payer: Medicare Other

## 2012-10-24 DIAGNOSIS — E538 Deficiency of other specified B group vitamins: Secondary | ICD-10-CM

## 2012-10-24 MED ORDER — CYANOCOBALAMIN 1000 MCG/ML IJ SOLN
1000.0000 ug | Freq: Once | INTRAMUSCULAR | Status: AC
  Administered 2012-10-24: 1000 ug via INTRAMUSCULAR

## 2012-11-01 ENCOUNTER — Other Ambulatory Visit: Payer: Medicare Other | Admitting: Gastroenterology

## 2012-11-18 ENCOUNTER — Other Ambulatory Visit: Payer: Self-pay | Admitting: Internal Medicine

## 2012-11-18 NOTE — Telephone Encounter (Signed)
Med filled.  

## 2012-11-25 ENCOUNTER — Ambulatory Visit: Payer: Medicare Other

## 2012-12-12 ENCOUNTER — Encounter: Payer: Self-pay | Admitting: Gastroenterology

## 2012-12-16 ENCOUNTER — Other Ambulatory Visit: Payer: Medicare Other | Admitting: Gastroenterology

## 2012-12-21 ENCOUNTER — Ambulatory Visit (AMBULATORY_SURGERY_CENTER): Payer: Self-pay | Admitting: *Deleted

## 2012-12-21 VITALS — Ht 68.0 in | Wt 172.0 lb

## 2012-12-21 DIAGNOSIS — Z860100 Personal history of colon polyps, unspecified: Secondary | ICD-10-CM

## 2012-12-21 MED ORDER — MOVIPREP 100 G PO SOLR: ORAL | Status: DC

## 2012-12-22 ENCOUNTER — Telehealth: Payer: Self-pay | Admitting: Gastroenterology

## 2012-12-22 NOTE — Telephone Encounter (Signed)
Left free voucher for pt at 4th floor desk.

## 2012-12-23 ENCOUNTER — Ambulatory Visit (INDEPENDENT_AMBULATORY_CARE_PROVIDER_SITE_OTHER): Payer: Medicare Other | Admitting: *Deleted

## 2012-12-23 DIAGNOSIS — E538 Deficiency of other specified B group vitamins: Secondary | ICD-10-CM

## 2012-12-23 MED ORDER — CYANOCOBALAMIN 1000 MCG/ML IJ SOLN
1000.0000 ug | Freq: Once | INTRAMUSCULAR | Status: AC
  Administered 2012-12-23: 1000 ug via INTRAMUSCULAR

## 2013-01-06 ENCOUNTER — Ambulatory Visit (AMBULATORY_SURGERY_CENTER): Payer: Medicare Other | Admitting: Gastroenterology

## 2013-01-06 ENCOUNTER — Encounter: Payer: Self-pay | Admitting: Gastroenterology

## 2013-01-06 VITALS — BP 105/70 | HR 54 | Temp 96.1°F | Resp 13 | Ht 68.0 in | Wt 172.0 lb

## 2013-01-06 DIAGNOSIS — D126 Benign neoplasm of colon, unspecified: Secondary | ICD-10-CM

## 2013-01-06 DIAGNOSIS — Z86010 Personal history of colonic polyps: Secondary | ICD-10-CM

## 2013-01-06 DIAGNOSIS — K5730 Diverticulosis of large intestine without perforation or abscess without bleeding: Secondary | ICD-10-CM

## 2013-01-06 DIAGNOSIS — K648 Other hemorrhoids: Secondary | ICD-10-CM

## 2013-01-06 MED ORDER — SODIUM CHLORIDE 0.9 % IV SOLN: 500.0000 mL | INTRAVENOUS | Status: DC

## 2013-01-06 NOTE — Progress Notes (Signed)
Patient did not experience any of the following events: a burn prior to discharge; a fall within the facility; wrong site/side/patient/procedure/implant event; or a hospital transfer or hospital admission upon discharge from the facility. (G8907) Patient did not have preoperative order for IV antibiotic SSI prophylaxis. (G8918)  

## 2013-01-06 NOTE — Patient Instructions (Addendum)
One of your biggest health concerns is your smoking.  This increases your risk for most cancers and serious cardiovascular diseases such as strokes, heart attacks.  You should try your best to stop.  If you need assistance, please contact your PCP or Smoking Cessation Class at [facility] ([phone]) or [facility] (1-800-QUIT-NOW).   Discharge instructions given with verbal understanding. Handout on polyps. Resume previous medications. YOU HAD AN ENDOSCOPIC PROCEDURE TODAY AT THE [facility]: Refer to the procedure report that was given to you for any specific questions about what was found during the examination.  If the procedure report does not answer your questions, please call your gastroenterologist to clarify.  If you requested that your care partner not be given the details of your procedure findings, then the procedure report has been included in a sealed envelope for you to review at your convenience later.  YOU SHOULD EXPECT: Some feelings of bloating in the abdomen. Passage of more gas than usual.  Walking can help get rid of the air that was put into your GI tract during the procedure and reduce the bloating. If you had a lower endoscopy (such as a colonoscopy or flexible sigmoidoscopy) you may notice spotting of blood in your stool or on the toilet paper. If you underwent a bowel prep for your procedure, then you may not have a normal bowel movement for a few days.  DIET: Your first meal following the procedure should be a light meal and then it is ok to progress to your normal diet.  A half-sandwich or bowl of soup is an example of a good first meal.  Heavy or fried foods are harder to digest and may make you feel nauseous or bloated.  Likewise meals heavy in dairy and vegetables can cause extra gas to form and this can also increase the bloating.  Drink plenty of fluids but you should avoid alcoholic beverages for 24 hours.  ACTIVITY: Your care partner should  take you home directly after the procedure.  You should plan to take it easy, moving slowly for the rest of the day.  You can resume normal activity the day after the procedure however you should NOT DRIVE or use heavy machinery for 24 hours (because of the sedation medicines used during the test).    SYMPTOMS TO REPORT IMMEDIATELY: A gastroenterologist can be reached at any hour.  During normal business hours, 8:30 AM to 5:00 PM Monday through Friday, call [phone].  After hours and on weekends, please call the GI answering service at [phone] who will take a message and have the physician on call contact you.   Following lower endoscopy (colonoscopy or flexible sigmoidoscopy):  Excessive amounts of blood in the stool  Significant tenderness or worsening of abdominal pains  Swelling of the abdomen that is new, acute  Fever of 100F or higher  FOLLOW UP: If any biopsies were taken you will be contacted by phone or by letter within the next 1-3 weeks.  Call your gastroenterologist if you have not heard about the biopsies in 3 weeks.  Our staff will call the home number listed on your records the next business day following your procedure to check on you and address any questions or concerns that you may have at that time regarding the information given to you following your procedure. This is a courtesy call and so if there is no answer at the home number and we have not heard from you through   the emergency physician on call, we will assume that you have returned to your regular daily activities without incident.  SIGNATURES/CONFIDENTIALITY: You and/or your care partner have signed paperwork which will be entered into your electronic medical record.  These signatures attest to the fact that that the information above on your After Visit Summary has been reviewed and is understood.  Full responsibility of the confidentiality of this discharge information lies with you and/or your  care-partner. 

## 2013-01-06 NOTE — Progress Notes (Signed)
Called to room to assist during endoscopic procedure.  Patient ID and intended procedure confirmed with present staff. Received instructions for my participation in the procedure from the performing physician.  

## 2013-01-06 NOTE — Progress Notes (Signed)
Procedure ends, to recovery, report given and VSS. 

## 2013-01-06 NOTE — Op Note (Signed)
[facility] [address] [place], [address]   COLONOSCOPY PROCEDURE REPORT PATIENT: Zachary, Fields  MR#: [id number] BIRTHDATE: 06/17/1945 , 67  yrs. old GENDER: Male ENDOSCOPIST: Rachael Fee, MD PROCEDURE DATE:  01/06/2013 PROCEDURE:   Colonoscopy with snare polypectomy First Screening Colonoscopy - Avg.  risk and is 50 yrs.  old or older - No.  Prior Negative Screening - Now for repeat screening. N/A  History of Adenoma - Now for follow-up colonoscopy & has been > or = to 3 yrs.  Yes hx of adenoma.  Has been 3 or more years since last colonoscopy.  Polyps Removed Today? Yes. ASA CLASS:   Class II INDICATIONS:2cm TA removed 2011. MEDICATIONS: Propofol (Diprivan) 170 mg IV and MAC sedation, administered by CRNA DESCRIPTION OF PROCEDURE:   After the risks benefits and alternatives of the procedure were thoroughly explained, informed consent was obtained.  A digital rectal exam revealed no abnormalities of the rectum.   The LB [id number]  endoscope was introduced through the anus and advanced to the cecum, which was identified by both the appendix and ileocecal valve. No adverse events experienced.   The quality of the prep was adequate.  The instrument was then slowly withdrawn as the colon was fully examined.  COLON FINDINGS: Two polyps were found, removed and sent to pathology.  Both were sessile, 3-38mm across, located in descending and rectum segments, removed with cold snare.  A third polyp was located on internal anal hemorrhoid (see pictures above).  This was 6-43mm across, was not removed due to concern for signficant bleeding given its location on an internal hemorrhoid.  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  The examination was otherwise normal.  Retroflexed views revealed no abnormalities. The time to cecum=5 minutes 24 seconds.  Withdrawal time=8 minutes 57 seconds.  The scope was withdrawn and the procedure  completed. COMPLICATIONS: There were no complications. ENDOSCOPIC IMPRESSION: Two polyps were found, removed and sent to pathology. A third polyp was located on internal anal hemorrhoid (see pictures above).  This was 6-49mm across, was not removed due to concern for signficant bleeding given its location on an internal hemorrhoid. 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 The examination was otherwise normal. RECOMMENDATIONS: 1. Given your personal history of adenomatous (pre-cancerous) polyps, you will need a repeat colonoscopy in 5 years even if the polyps above are not precancerous. You will receive a letter within 1-2 weeks with the results of your biopsy as well as final recommendations.  Please call my office if you have not received a letter after 3 weeks. 2. My office will set up referral to general surgeon to consider transanal polyp removal for the remaining polyp located on an internal hemorrhoid.  cc: Willow Ora, MD eSigned:  Rachael Fee, MD 01/06/2013 8:20 AM

## 2013-01-09 ENCOUNTER — Telehealth: Payer: Self-pay

## 2013-01-09 NOTE — Telephone Encounter (Signed)
  Follow up Call-  Call back number 01/06/2013  Post procedure Call Back phone  # [phone]  Permission to leave phone message Yes     Patient questions:  Do you have a fever, pain , or abdominal swelling? no Pain Score  0 *  Have you tolerated food without any problems? yes  Have you been able to return to your normal activities? yes  Do you have any questions about your discharge instructions: Diet   no Medications  no Follow up visit  no  Do you have questions or concerns about your Care? no  Actions: * If pain score is 4 or above: No action needed, pain <4.

## 2013-01-11 ENCOUNTER — Encounter: Payer: Self-pay | Admitting: Gastroenterology

## 2013-01-13 ENCOUNTER — Encounter: Payer: Self-pay | Admitting: Internal Medicine

## 2013-01-13 ENCOUNTER — Ambulatory Visit (INDEPENDENT_AMBULATORY_CARE_PROVIDER_SITE_OTHER): Payer: Medicare Other | Admitting: Internal Medicine

## 2013-01-13 ENCOUNTER — Telehealth: Payer: Self-pay | Admitting: Internal Medicine

## 2013-01-13 VITALS — BP 129/78 | HR 77 | Temp 98.7°F | Wt 175.6 lb

## 2013-01-13 DIAGNOSIS — J40 Bronchitis, not specified as acute or chronic: Secondary | ICD-10-CM

## 2013-01-13 MED ORDER — ALBUTEROL SULFATE HFA 108 (90 BASE) MCG/ACT IN AERS: 2.0000 | INHALATION_SPRAY | Freq: Four times a day (QID) | RESPIRATORY_TRACT | Status: DC | PRN

## 2013-01-13 MED ORDER — DOXYCYCLINE HYCLATE 100 MG PO TABS: 100.0000 mg | ORAL_TABLET | Freq: Two times a day (BID) | ORAL | Status: DC

## 2013-01-13 MED ORDER — AZITHROMYCIN 250 MG PO TABS: ORAL_TABLET | ORAL | Status: DC

## 2013-01-13 NOTE — Assessment & Plan Note (Addendum)
Mild bronchitis, see instructions. Reports wheezing, will prescribe albuterol to take as needed. Will offer a flu shot on RTC (few days for a CPX) Addendum--Z-Pak not covered, will switch to doxycycline, new prescription sent

## 2013-01-13 NOTE — Telephone Encounter (Signed)
Pt notified. DJR  

## 2013-01-13 NOTE — Telephone Encounter (Signed)
Advise patient's daughter, I sent a new prescription for doxycycline

## 2013-01-13 NOTE — Progress Notes (Signed)
Pre visit review using our clinic review tool, if applicable. No additional management support is needed unless otherwise documented below in the visit note. 

## 2013-01-13 NOTE — Progress Notes (Signed)
  Subjective:    Patient ID: Zachary Fields, male    DOB: February 14, 1946, 67 y.o.   MRN: [id number]  HPI Not feeling well for the last 10-14 days: Chest congestion, "cough spasms", no sputum production. His daughter gave him 3 antibiotic tablets , name?, He felt a little better   Past Medical History  Diagnosis Date  . Hypertension   . Anemia   . 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 CRI (chronic renal insufficiency)   . Depression   . Hyperhomocysteinemia   . 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 Venetia Maxon in 1997. Shunt replaced 06-2008  . Dizziness     Chronic paresthesias and dizziness not related with stroke  . Renal cyst     08-2009 Renal cysts( R ) at outside hospital (seen also in a u/s 2007), [facility] for f/u u/s 11-11  . Deaf     L ear, poor hearing R   . 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 Diverticulosis   . Colon polyps   . Memory impairment     likely secondary to radiation therapy   Past Surgical History  Procedure Laterality Date  . Laparotomy      s/p remobal of swallowed of a FB from the stomach when he was 67 y/o    . Brain surgery      s/p a VP shunt, revised  by Dr. Venetia Maxon in 1997. Shunt replaced 06-2008.    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 No foreign trips. Admits to mild wheezing without  shortness of breath or chest pain. Some sinus congestion    Objective:   Physical Exam BP 129/78  Pulse 77  Temp(Src) 98.7 F (37.1 C)  Wt 175 lb 9.6 oz (79.652 kg)  SpO2 93% General -- alert, well-developed, NAD.  HEENT--   TMs normal, throat symmetric, no redness or discharge. Face symmetric, sinuses not tender to palpation. Nose slt congested.  Lungs -- normal respiratory effort, no intercostal retractions, no accessory muscle  use, and  Few rhonchi, no wheezing. Heart-- normal rate, regular rhythm, no murmur.  Neurologic--  alert & oriented X3. Speech normal. Psych-- Cognition and judgment appear intact. Cooperative with normal attention span and concentration. No anxious appearing , no depressed appearing.      Assessment & Plan:

## 2013-01-13 NOTE — Patient Instructions (Signed)
Rest, fluids , tylenol For cough, take Mucinex DM twice a day until better  If you have persistent cough or wheezing: Use albuterol 2 puffs every 6 hours as needed. Take the antibiotic as prescribed  (zithromax) Call if no better in few days Call anytime if the symptoms are severe

## 2013-01-13 NOTE — Telephone Encounter (Signed)
Patient's daughter called and states that the Z-pack he was prescribed today is not covered under his insurance. She wants to know if Dr. Drue Novel can rx something else. Pharmacy did not make any recommendations that are covered.

## 2013-01-20 ENCOUNTER — Ambulatory Visit (INDEPENDENT_AMBULATORY_CARE_PROVIDER_SITE_OTHER): Payer: Medicare Other | Admitting: Internal Medicine

## 2013-01-20 ENCOUNTER — Ambulatory Visit: Payer: Medicare Other

## 2013-01-20 ENCOUNTER — Encounter: Payer: Self-pay | Admitting: Internal Medicine

## 2013-01-20 VITALS — BP 117/79 | HR 94 | Temp 97.8°F | Wt 179.0 lb

## 2013-01-20 DIAGNOSIS — E538 Deficiency of other specified B group vitamins: Secondary | ICD-10-CM

## 2013-01-20 DIAGNOSIS — J40 Bronchitis, not specified as acute or chronic: Secondary | ICD-10-CM

## 2013-01-20 MED ORDER — SULFAMETHOXAZOLE-TMP DS 800-160 MG PO TABS: 1.0000 | ORAL_TABLET | Freq: Two times a day (BID) | ORAL | Status: DC

## 2013-01-20 MED ORDER — HYDROCODONE-HOMATROPINE 5-1.5 MG/5ML PO SYRP: 5.0000 mL | ORAL_SOLUTION | Freq: Every evening | ORAL | Status: DC | PRN

## 2013-01-20 MED ORDER — CYANOCOBALAMIN 1000 MCG/ML IJ SOLN: 1000.0000 ug | Freq: Once | INTRAMUSCULAR | Status: DC

## 2013-01-20 NOTE — Progress Notes (Signed)
Pre visit review using our clinic review tool, if applicable. No additional management support is needed unless otherwise documented below in the visit note. 

## 2013-01-20 NOTE — Patient Instructions (Addendum)
Get the XR at [facility], corner of [address] (10 minutes form here); they are open 24/7 [address]  [place], [place] [address] [phone]  For cough, take Mucinex DM twice a day until better  For persistent cough at night, take hydrocodone,  will cause drowsiness , be careful Stop albuterol Use the sample for Symbicort 2 puffs twice a day Take the antibiotic as prescribed  (Bactrim) Call if no better in few days Stop smoking at least for few days  Please reschedule your physical at your earliest convenience

## 2013-01-20 NOTE — Assessment & Plan Note (Addendum)
Ongoing respiratory symptoms, patient is a heavy smoker, status post doxycycline, unable to afford zpack. Plan: Chest x-ray  Hold tobacco mucinex DM symbicort sample, d/c albuterol Bactrim (atypical infex?) If no better will need further eval

## 2013-01-20 NOTE — Progress Notes (Signed)
  Subjective:    Patient ID: Zachary Fields, male    DOB: [birth year], 67 y.o.   MRN: [id number]  HPI Followup visit Was seen with bronchitis, prescribed doxycycline which he took. Continue with "cough spasms ", not feeling much better, symptoms worse at night. Reports occ wheezing, he is using albuterol actually every for hours most days, does not seem to help much. Due for a B12 shot.  Past Medical History  Diagnosis Date  . Hypertension   . Anemia   . 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 CRI (chronic renal insufficiency)   . Depression   . Hyperhomocysteinemia   . 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 Venetia Maxon in 1997. Shunt replaced 06-2008  . Dizziness     Chronic paresthesias and dizziness not related with stroke  . Renal cyst     08-2009 Renal cysts( R ) at outside hospital (seen also in a u/s 2007), [facility] for f/u u/s 11-11  . Deaf     L ear, poor hearing R   . 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 Diverticulosis   . Colon polyps   . Memory impairment     likely secondary to radiation therapy   Past Surgical History  Procedure Laterality Date  . Laparotomy      s/p remobal of swallowed of a FB from the stomach when he was 67 y/o    . Brain surgery      s/p a VP shunt, revised  by Dr. Venetia Maxon in 1997. Shunt replaced 06-2008.    Review of Systems Continue smoking as before. Occasionally produces some sputum, color? Denies chest pain or shortness of breath No nausea, vomiting, diarrhea. Very mild postnasal dripping.     Objective:   Physical Exam BP 117/79  Pulse 94  Temp(Src) 97.8 F (36.6 C)  Wt 179 lb (81.194 kg)  SpO2 92% General -- alert, well-developed, NAD.  Neck --no LAD HEENT-- Not pale. TMs normal, throat symmetric, no redness or discharge. Face symmetric, sinuses not tender to palpation. Nose slt congested. Lungs -- normal respiratory effort, no intercostal  retractions, no accessory muscle use, and some ronchi,clear w/ cough; no wheezing Heart-- normal rate, regular rhythm, no murmur.   Extremities-- no pretibial edema bilaterally  Neurologic--  alert & oriented X3. Speech normal, gait normal, strength normal in all extremities.  Psych-- Cognition and judgment appear intact. Cooperative with normal attention span and concentration. No anxious appearing , no depressed appearing.      Assessment & Plan:

## 2013-01-23 ENCOUNTER — Ambulatory Visit (INDEPENDENT_AMBULATORY_CARE_PROVIDER_SITE_OTHER)
Admission: RE | Admit: 2013-01-23 | Discharge: 2013-01-23 | Disposition: A | Discharge status: Home / Self Care | Payer: Medicare Other | Source: Ambulatory Visit | Attending: Internal Medicine | Admitting: Internal Medicine

## 2013-01-23 DIAGNOSIS — J40 Bronchitis, not specified as acute or chronic: Secondary | ICD-10-CM

## 2013-01-26 ENCOUNTER — Telehealth: Payer: Self-pay

## 2013-01-26 ENCOUNTER — Encounter: Payer: Self-pay | Admitting: Lab

## 2013-01-26 NOTE — Telephone Encounter (Signed)
Medication List and allergies: reviewed and updated  90 day supply/mail order: na Local prescriptions: [facility] [place]  Immunizations due: Flu and PNA Has been ill  A/P:   No changes to FH or PSH Tdap--11/2005 Shingles-- [facility]--01/2013--Dr jacobs--hx + adenomatous polyps--next due 2019 PSA--12/2010--0.68  To Discuss with Provider: Not at this time

## 2013-01-27 ENCOUNTER — Ambulatory Visit (INDEPENDENT_AMBULATORY_CARE_PROVIDER_SITE_OTHER): Payer: Medicare Other | Admitting: Internal Medicine

## 2013-01-27 ENCOUNTER — Encounter: Payer: Self-pay | Admitting: Internal Medicine

## 2013-01-27 VITALS — BP 134/85 | HR 95 | Temp 98.7°F | Resp 18 | Ht 68.0 in | Wt 166.0 lb

## 2013-01-27 DIAGNOSIS — Z13820 Encounter for screening for osteoporosis: Secondary | ICD-10-CM

## 2013-01-27 DIAGNOSIS — N400 Enlarged prostate without lower urinary tract symptoms: Secondary | ICD-10-CM

## 2013-01-27 DIAGNOSIS — J40 Bronchitis, not specified as acute or chronic: Secondary | ICD-10-CM

## 2013-01-27 DIAGNOSIS — N189 Chronic kidney disease, unspecified: Secondary | ICD-10-CM

## 2013-01-27 DIAGNOSIS — R413 Other amnesia: Secondary | ICD-10-CM

## 2013-01-27 DIAGNOSIS — I10 Essential (primary) hypertension: Secondary | ICD-10-CM

## 2013-01-27 DIAGNOSIS — J449 Chronic obstructive pulmonary disease, unspecified: Secondary | ICD-10-CM

## 2013-01-27 DIAGNOSIS — Z0000 Encounter for general adult medical examination without abnormal findings: Secondary | ICD-10-CM

## 2013-01-27 DIAGNOSIS — Z23 Encounter for immunization: Secondary | ICD-10-CM

## 2013-01-27 LAB — CBC WITH DIFFERENTIAL/PLATELET
Eosinophils Absolute: 0 10*3/uL (ref 0.0–0.7)
Eosinophils Relative: 0.3 % (ref 0.0–5.0)
HCT: 37.5 % — ABNORMAL LOW (ref 39.0–52.0)
Hemoglobin: 12.4 g/dL — ABNORMAL LOW (ref 13.0–17.0)
Lymphs Abs: 1.2 10*3/uL (ref 0.7–4.0)
MCHC: 33.1 g/dL (ref 30.0–36.0)
MCV: 82.9 fl (ref 78.0–100.0)
Monocytes Absolute: 0.4 10*3/uL (ref 0.1–1.0)
Monocytes Relative: 6.1 % (ref 3.0–12.0)
Neutro Abs: 5.6 10*3/uL (ref 1.4–7.7)
Platelets: 266 10*3/uL (ref 150.0–400.0)
RDW: 16 % — ABNORMAL HIGH (ref 11.5–14.6)
WBC: 7.2 10*3/uL (ref 4.5–10.5)

## 2013-01-27 LAB — LIPID PANEL
Cholesterol: 111 mg/dL (ref 0–200)
LDL Cholesterol: 35 mg/dL (ref 0–99)
VLDL: 4.2 mg/dL (ref 0.0–40.0)

## 2013-01-27 LAB — URINALYSIS, ROUTINE W REFLEX MICROSCOPIC
Bilirubin Urine: NEGATIVE
Leukocytes, UA: NEGATIVE
Specific Gravity, Urine: 1.025 (ref 1.000–1.030)
Total Protein, Urine: NEGATIVE
Urine Glucose: NEGATIVE

## 2013-01-27 LAB — COMPREHENSIVE METABOLIC PANEL
ALT: 18 U/L (ref 0–53)
Alkaline Phosphatase: 92 U/L (ref 39–117)
Creatinine, Ser: 3 mg/dL — ABNORMAL HIGH (ref 0.4–1.5)
GFR: 22.48 mL/min — ABNORMAL LOW (ref >60.00)
Glucose, Bld: 93 mg/dL (ref 70–99)
Sodium: 137 mEq/L (ref 135–145)
Total Bilirubin: 0.5 mg/dL (ref 0.3–1.2)
Total Protein: 6.8 g/dL (ref 6.0–8.3)

## 2013-01-27 LAB — TSH: TSH: 0.9 u[IU]/mL (ref 0.35–5.50)

## 2013-01-27 LAB — PSA: PSA: 1.2 ng/mL (ref 0.10–4.00)

## 2013-01-27 MED ORDER — LOSARTAN POTASSIUM 50 MG PO TABS: 50.0000 mg | ORAL_TABLET | Freq: Every day | ORAL | Status: DC

## 2013-01-27 NOTE — Assessment & Plan Note (Signed)
Mild symptoms, no change for now, check a PSA and a UA

## 2013-01-27 NOTE — Assessment & Plan Note (Addendum)
seems well-controlled, on Lasix and felodipine. He does have some swelling at the ankles. Plan: BMP today, discontinue felodipine, start losartan 50. BMP in 2 weeks. Discussed with the patient I will discuss with his daughter as well. ADDENDUM: creatinine come back elevated to 3.0 thus plan is to stay on felodipine, disregard losartan, nephrology referral---see labs comments

## 2013-01-27 NOTE — Assessment & Plan Note (Signed)
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 Plan: PFTs on return to the office in 3 months

## 2013-01-27 NOTE — Progress Notes (Signed)
Pre-visit discussion using our clinic review tool. No additional management support is needed unless otherwise documented below in the visit note.  

## 2013-01-27 NOTE — Assessment & Plan Note (Signed)
Today the patient is oriented x3 and has a good memory, for instance he recall all the details about his colonoscopy.

## 2013-01-27 NOTE — Progress Notes (Signed)
Subjective:    Patient ID: Zachary Fields, male    DOB: October 24, 1945, 67 y.o.   MRN: [id number]  HPI Here for Medicare AWV:  1. Risk factors based on Past M, S, F history: reviewed 2. Physical Activities:  sedentary 3. Depression/mood: neg screening  4. Hearing:  Poor hearing, L deafness, declined referral 5. ADL's:  Independent , drives  6. Fall Risk: no recent falls , see instructions  7. home Safety: does feel safe at home  8. Height, weight, & visual acuity: see VS, reports poor vision-----> no recent eval, agreed to a referral 9. Counseling: provided 10. Labs ordered based on risk factors: if needed  11. Referral Coordination: if needed 12. Care Plan, see assessment and plan  13. Cognitive Assessment: cognition seems appropriate, motor skills less than average for age  In addition, today we discussed the following: Was seen recently with bronchitis, cough slightly better, I asked if Symbicort helped: "I don't know".See review of systems  ; patient still smoking. Hypertension--good medication compliance, not ambulatory BPs. BPH--on Flomax, still has some difficulty urinating.   Past Medical History  Diagnosis Date  . Hypertension   . Anemia   . 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 CRI (chronic renal insufficiency)   . Depression   . Hyperhomocysteinemia   . 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 Venetia Maxon in 1997. Shunt replaced 06-2008  . Dizziness     Chronic paresthesias and dizziness not related with stroke  . Renal cyst     08-2009 Renal cysts( R ) at outside hospital (seen also in a u/s 2007), [facility] for f/u u/s 11-11  . Deaf     L ear, poor hearing R   . 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 Diverticulosis   . Colon polyps   . 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 BPH (benign prostatic hyperplasia) 12/12/2010   Past Surgical History  Procedure  Laterality Date  . Laparotomy      s/p removal of swallowed of a FB from the stomach when he was 67 y/o    . Brain surgery  several    s/p a VP shunt, revised  by Dr. Venetia Maxon in 1997. Shunt replaced 06-2008.   History   Social History  . Marital Status: Divorced    Spouse Name: N/A    Number of Children: 2  . Years of Education: N/A   Occupational History  . accountant    Social History Main Topics  . Smoking status: Current Every Day Smoker -- 1.50 packs/day    Types: Cigarettes  . Smokeless tobacco: Never Used  . Alcohol Use: No     Comment: social/rare  . Drug Use: No  . Sexual Activity: Not on file   Other Topics Concern  . Not on file   Social History Narrative   Works in [place], 2 daughters ([name]), lives w/  [name]    Family History  Problem Relation Age of Onset  . Colon cancer Mother 62    colon  . Cancer Brother     bladder  . Prostate cancer Neg Hx   . Diabetes Neg Hx   . Stroke Neg Hx   . Coronary artery disease      Uncle MI at  age 63s    Review of Systems No  CP, lower extremity edema Some DOE, not a new issue Denies  nausea, vomiting diarrhea Denies  blood in the stools Mild sputum production, (-)hemoptysis No dysuria, gross hematuria        Objective:   Physical Exam BP 134/85  Pulse 95  Temp(Src) 98.7 F (37.1 C) (Tympanic)  Resp 18  Ht 5\' 8"  (1.727 m)  Wt 166 lb (75.297 kg)  BMI 25.25 kg/m2  SpO2 93% General -- alert, well-developed, NAD.  Neck --no thyromegaly  Lungs -- normal respiratory effort, no intercostal retractions, no accessory muscle use, and normal breath sounds. Frequent cough noted but at this time he has no rhonchi.  Heart-- normal rate, regular rhythm, no murmur.  Abdomen-- Not distended, good bowel sounds,soft, non-tender.  Rectal--  Normal sphincter tone. No rectal masses or tenderness. Brown stool  Prostate--Prostate gland firm and smooth, no enlargement, nodularity,  mass, asymmetry or induration.  Mild tenderness?  Extremities-- trace  pretibial edema bilaterally , +/+++ peri ankle edema b. Neurologic--  alert & oriented X3. Speech normal, gait slt frail, small steps,  Psych-- Cognition and judgment appear intact. Cooperative with normal attention span and concentration. No anxious appearing , no depressed appearing.      Assessment & Plan:

## 2013-01-27 NOTE — Patient Instructions (Signed)
Get your blood work before you leave  Next visit in 3 months  for a  hypertension -cough  follow up (30 minutes) No Fasting Please make an appointment    Is very important that you see your dentist  Stop felodipine, start losartan Come back in 2 weeks for blood work only: BMP, hypertension Check the  blood pressure 2 or 3 times a   week be sure it is between 110/60 and 140/85. Ideal blood pressure is 120/80. If it is consistently higher or lower, let me know  If the cough continue beyond 2 weeks, please let me know, I will send you to a specialist     Fall Prevention and Home Safety Falls cause injuries and can affect all age groups. It is possible to use preventive measures to significantly decrease the likelihood of falls. There are many simple measures which can make your home safer and prevent falls. OUTDOORS  Repair cracks and edges of walkways and driveways.  Remove high doorway thresholds.  Trim shrubbery on the main path into your home.  Have good outside lighting.  Clear walkways of tools, rocks, debris, and clutter.  Check that handrails are not broken and are securely fastened. Both sides of steps should have handrails.  Have leaves, snow, and ice cleared regularly.  Use sand or salt on walkways during winter months.  In the garage, clean up grease or oil spills. BATHROOM  Install night lights.  Install grab bars by the toilet and in the tub and shower.  Use non-skid mats or decals in the tub or shower.  Place a plastic non-slip stool in the shower to sit on, if needed.  Keep floors dry and clean up all water on the floor immediately.  Remove soap buildup in the tub or shower on a regular basis.  Secure bath mats with non-slip, double-sided rug tape.  Remove throw rugs and tripping hazards from the floors. BEDROOMS  Install night lights.  Make sure a bedside light is easy to reach.  Do not use oversized bedding.  Keep a telephone by your  bedside.  Have a firm chair with side arms to use for getting dressed.  Remove throw rugs and tripping hazards from the floor. KITCHEN  Keep handles on pots and pans turned toward the center of the stove. Use back burners when possible.  Clean up spills quickly and allow time for drying.  Avoid walking on wet floors.  Avoid hot utensils and knives.  Position shelves so they are not too high or low.  Place commonly used objects within easy reach.  If necessary, use a sturdy step stool with a grab bar when reaching.  Keep electrical cables out of the way.  Do not use floor polish or wax that makes floors slippery. If you must use wax, use non-skid floor wax.  Remove throw rugs and tripping hazards from the floor. STAIRWAYS  Never leave objects on stairs.  Place handrails on both sides of stairways and use them. Fix any loose handrails. Make sure handrails on both sides of the stairways are as long as the stairs.  Check carpeting to make sure it is firmly attached along stairs. Make repairs to worn or loose carpet promptly.  Avoid placing throw rugs at the top or bottom of stairways, or properly secure the rug with carpet tape to prevent slippage. Get rid of throw rugs, if possible.  Have an electrician put in a light switch at the top and bottom of the  stairs. OTHER FALL PREVENTION TIPS  Wear low-heel or rubber-soled shoes that are supportive and fit well. Wear closed toe shoes.  When using a stepladder, make sure it is fully opened and both spreaders are firmly locked. Do not climb a closed stepladder.  Add color or contrast paint or tape to grab bars and handrails in your home. Place contrasting color strips on first and last steps.  Learn and use mobility aids as needed. Install an electrical emergency response system.  Turn on lights to avoid dark areas. Replace light bulbs that burn out immediately. Get light switches that glow.  Arrange furniture to create clear  pathways. Keep furniture in the same place.  Firmly attach carpet with non-skid or double-sided tape.  Eliminate uneven floor surfaces.  Select a carpet pattern that does not visually hide the edge of steps.  Be aware of all pets. OTHER HOME SAFETY TIPS  Set the water temperature for 120 F (48.8 C).  Keep emergency numbers on or near the telephone.  Keep smoke detectors on every level of the home and near sleeping areas. Document Released: 02/13/2002 Document Revised: 08/25/2011 Document Reviewed: 05/15/2011 [facility] Patient Information 2014 [place], [place].

## 2013-01-27 NOTE — Assessment & Plan Note (Addendum)
Cough slightly decreased but still > than baseline. Chest x-ray was normal. Recommend patient to call in 2 or 3 weeks if not gradually improving

## 2013-01-27 NOTE — Assessment & Plan Note (Addendum)
Td 07 flu shot today shingles shot 2012 tobacco : still smoking---->  counseled, rec to see a dentist; pt clearly stated is not ready to think about it  Colonoscopy 09-2009, again Cscope 01/2013--Dr jacobs--hx + adenomatous polyps--next due 2019  diet and exercise Patient is a smoker, has a small frame, sedentary -- get a DEXA Pt authorized me to talk with daughter [name] who is a Engineer, civil (consulting) in the system

## 2013-01-28 ENCOUNTER — Encounter: Payer: Self-pay | Admitting: Internal Medicine

## 2013-01-28 MED ORDER — FELODIPINE ER 10 MG PO TB24: 10.0000 mg | ORAL_TABLET | Freq: Every day | ORAL | Status: DC

## 2013-02-10 ENCOUNTER — Inpatient Hospital Stay: Admission: RE | Admit: 2013-02-10 | Payer: Medicare Other | Source: Ambulatory Visit

## 2013-02-24 ENCOUNTER — Ambulatory Visit: Payer: Medicare Other

## 2013-02-27 ENCOUNTER — Ambulatory Visit (INDEPENDENT_AMBULATORY_CARE_PROVIDER_SITE_OTHER): Payer: Medicare Other | Admitting: *Deleted

## 2013-02-27 DIAGNOSIS — E538 Deficiency of other specified B group vitamins: Secondary | ICD-10-CM

## 2013-02-27 MED ORDER — CYANOCOBALAMIN 1000 MCG/ML IJ SOLN
1000.0000 ug | Freq: Once | INTRAMUSCULAR | Status: AC
  Administered 2013-02-27: 1000 ug via INTRAMUSCULAR

## 2013-03-27 ENCOUNTER — Other Ambulatory Visit: Payer: Self-pay | Admitting: Internal Medicine

## 2013-03-28 NOTE — Telephone Encounter (Signed)
Tamsulosin refilled per protocol. JG//CMA 

## 2013-03-31 ENCOUNTER — Ambulatory Visit (INDEPENDENT_AMBULATORY_CARE_PROVIDER_SITE_OTHER): Payer: Medicare HMO | Admitting: *Deleted

## 2013-03-31 ENCOUNTER — Encounter (INDEPENDENT_AMBULATORY_CARE_PROVIDER_SITE_OTHER): Payer: Self-pay

## 2013-03-31 DIAGNOSIS — E538 Deficiency of other specified B group vitamins: Secondary | ICD-10-CM

## 2013-03-31 MED ORDER — CYANOCOBALAMIN 1000 MCG/ML IJ SOLN
1000.0000 ug | Freq: Once | INTRAMUSCULAR | Status: AC
  Administered 2013-03-31: 1000 ug via INTRAMUSCULAR

## 2013-04-22 ENCOUNTER — Other Ambulatory Visit: Payer: Self-pay | Admitting: Internal Medicine

## 2013-04-28 ENCOUNTER — Ambulatory Visit (INDEPENDENT_AMBULATORY_CARE_PROVIDER_SITE_OTHER): Payer: Medicare HMO

## 2013-04-28 ENCOUNTER — Telehealth: Payer: Self-pay

## 2013-04-28 DIAGNOSIS — E538 Deficiency of other specified B group vitamins: Secondary | ICD-10-CM

## 2013-04-28 MED ORDER — CYANOCOBALAMIN 1000 MCG/ML IJ SOLN
1000.0000 ug | Freq: Once | INTRAMUSCULAR | Status: AC
  Administered 2013-04-28: 1000 ug via INTRAMUSCULAR

## 2013-04-28 NOTE — Progress Notes (Deleted)
Patient ID: Zachary Fields, male   DOB: 05/12/1945, 68 y.o.   MRN: [id number] Pre visit review using our clinic review tool, if applicable. No additional management support is needed unless otherwise documented below in the visit note.   

## 2013-04-28 NOTE — Progress Notes (Signed)
Patient ID: Zachary Fields, male   DOB: 06-26-1945, 68 y.o.   MRN: [id number] Pre visit review using our clinic review tool, if applicable. No additional management support is needed unless otherwise documented below in the visit note.

## 2013-05-17 NOTE — Telephone Encounter (Signed)
Error

## 2013-05-19 ENCOUNTER — Other Ambulatory Visit: Payer: Medicare HMO

## 2013-05-19 ENCOUNTER — Other Ambulatory Visit: Payer: Self-pay | Admitting: Nephrology

## 2013-05-19 DIAGNOSIS — N179 Acute kidney failure, unspecified: Secondary | ICD-10-CM

## 2013-05-25 ENCOUNTER — Ambulatory Visit (INDEPENDENT_AMBULATORY_CARE_PROVIDER_SITE_OTHER): Payer: Medicare HMO

## 2013-05-25 DIAGNOSIS — E538 Deficiency of other specified B group vitamins: Secondary | ICD-10-CM

## 2013-05-25 MED ORDER — CYANOCOBALAMIN 1000 MCG/ML IJ SOLN
1000.0000 ug | Freq: Once | INTRAMUSCULAR | Status: AC
  Administered 2013-05-25: 1000 ug via INTRAMUSCULAR

## 2013-05-26 ENCOUNTER — Ambulatory Visit: Payer: Medicare HMO

## 2013-05-26 ENCOUNTER — Ambulatory Visit
Admission: RE | Admit: 2013-05-26 | Discharge: 2013-05-26 | Disposition: A | Discharge status: Home / Self Care | Payer: Medicare HMO | Source: Ambulatory Visit | Attending: Nephrology | Admitting: Nephrology

## 2013-05-26 DIAGNOSIS — N179 Acute kidney failure, unspecified: Secondary | ICD-10-CM

## 2013-05-30 ENCOUNTER — Telehealth: Payer: Self-pay | Admitting: Internal Medicine

## 2013-05-30 NOTE — Telephone Encounter (Signed)
Spoke with patient.  Patient stated that his legs are swollen and he is continuing to have problems with urinating even though he's been on Lasix and Flomax.  Patient states that his legs feel tight and his bladder often feels full resulting in frequent trips to the bathroom; however, he is having difficulty fully emptying his bladder.  Patient states that the stream starts and then "shuts off like a faucet."  After a while, very slowly, the patient is able to relieve himself.  He denies abdominal discomfort and says that he feels his bladder is completely empty at the moment.  He also shared that occasionally he experiences burning at the urethral opening.  Based on patient's symptoms, patient was encouraged to go to the emergency room for a bladder scan.  Patient stated understanding and stated he was on his way.

## 2013-05-30 NOTE — Telephone Encounter (Signed)
Patient daughter called and stated that Zachary Fields was diagnosed with level three kidney disease and he is on furosemide (LASIX) 40 MG tablet twice a day for three days. Also he is taking tamsulosin (FLOMAX) 0.4 MG CAPS capsule. Patient feet are swollen and is having trouble urinating. Please advise.

## 2013-05-31 ENCOUNTER — Ambulatory Visit (INDEPENDENT_AMBULATORY_CARE_PROVIDER_SITE_OTHER): Payer: Medicare HMO | Admitting: Nurse Practitioner

## 2013-05-31 ENCOUNTER — Ambulatory Visit: Payer: Medicare HMO | Admitting: Internal Medicine

## 2013-05-31 ENCOUNTER — Encounter: Payer: Self-pay | Admitting: Nurse Practitioner

## 2013-05-31 ENCOUNTER — Telehealth: Payer: Self-pay | Admitting: Internal Medicine

## 2013-05-31 ENCOUNTER — Other Ambulatory Visit: Payer: Self-pay | Admitting: *Deleted

## 2013-05-31 VITALS — BP 117/69 | HR 79 | Temp 98.1°F | Ht 68.0 in | Wt 175.8 lb

## 2013-05-31 DIAGNOSIS — N400 Enlarged prostate without lower urinary tract symptoms: Secondary | ICD-10-CM

## 2013-05-31 DIAGNOSIS — R319 Hematuria, unspecified: Secondary | ICD-10-CM

## 2013-05-31 LAB — POCT URINALYSIS DIPSTICK
BILIRUBIN UA: NEGATIVE
Glucose, UA: NEGATIVE
KETONES UA: NEGATIVE
LEUKOCYTES UA: NEGATIVE
Nitrite, UA: NEGATIVE
Protein, UA: NEGATIVE
Spec Grav, UA: <=1.005
Urobilinogen, UA: 0.2
pH, UA: 6.5

## 2013-05-31 MED ORDER — FINASTERIDE 5 MG PO TABS: 5.0000 mg | ORAL_TABLET | Freq: Every day | ORAL | Status: DC

## 2013-05-31 NOTE — Patient Instructions (Signed)
Benign Prostatic Hyperplasia An enlarged prostate (benign prostatic hyperplasia) is common in older men. You may experience the following:  Weak urine stream.  Dribbling.  Feeling like the bladder has not emptied completely.  Difficulty starting urination.  Getting up frequently at night to urinate.  Urinating more frequently during the day. HOME CARE INSTRUCTIONS  Monitor your prostatic hyperplasia for any changes. The following actions may help to alleviate any discomfort you are experiencing:  Give yourself time when you urinate.  Stay away from alcohol.  Avoid beverages containing caffeine, such as coffee, tea, and colas, because they can make the problem worse.  Avoid decongestants, antihistamines, and some prescription medicines that can make the problem worse.  Follow up with your health care provider for further treatment as recommended. SEEK MEDICAL CARE IF:  You are experiencing progressive difficulty voiding.  Your urine stream is progressively getting narrower.  You are awaking from sleep with the urge to void more frequently.  You are constantly feeling the need to void.  You experience loss of urine, especially in small amounts. SEEK IMMEDIATE MEDICAL CARE IF:   You develop increased pain with urination or are unable to urinate.  You develop severe abdominal pain, vomiting, a high fever, or fainting.  You develop back pain or blood in your urine. MAKE SURE YOU:   Understand these instructions.  Will watch your condition.  Will get help right away if you are not doing well or get worse. Document Released: 02/23/2005 Document Revised: 10/26/2012 Document Reviewed: 07/26/2012 [employer] Patient Information 2014 [employer].  

## 2013-05-31 NOTE — Progress Notes (Signed)
Pre visit review using our clinic review tool, if applicable. No additional management support is needed unless otherwise documented below in the visit note. 

## 2013-05-31 NOTE — Progress Notes (Signed)
Subjective:     Zachary Fields is a 68 y.o. male here for evaluation of symptoms of BPH. Onset of symptoms was 1 weeks ago and onset was gradual. Symptoms include straining and weak stream. He denies frequency and urgency. He reports a history of no complicating symptoms and BPH, chronic kidney disease stage 3. He denies flank pain, gross hematuria, kidney stones and recurrent UTI. He is also concerned about increased swelling of bilat LE. He reports visit w/nephrologist.  The following portions of the patient's history were reviewed and updated as appropriate: allergies, current medications, past medical history, past surgical history and problem list.  Review of Systems Pertinent items are noted in HPI.    Objective:    BP 117/69  Pulse 79  Temp(Src) 98.1 F (36.7 C) (Oral)  Ht 5\' 8"  (1.727 m)  Wt 175 lb 12.8 oz (79.742 kg)  BMI 26.74 kg/m2  SpO2 95% General appearance: alert, cooperative, appears older than stated age and no distress Head: Normocephalic, without obvious abnormality, atraumatic Eyes: negative findings: lids and lashes normal and conjunctivae and sclerae normal R eye lids closed. Lungs: clear to auscultation bilaterally Heart: regular rate and rhythm, S1, S2 normal, no murmur, click, rub or gallop Abdomen: soft, non-tender; bowel sounds normal; no masses,  no organomegaly Extremities: edema +3 pitting edema from feet to knees Pulses: 2+ and symmetric    Assessment:    Symptomatic BPH   last PSA nml.  POC ua- pos blood, o/w nml. Saw nephrology few days ago-pt states he was told to restrict sodium. Plan:  Continue flomax.   Will start a trial of Proscar. Follow up in 2 weeks or sooner as needed.

## 2013-05-31 NOTE — Telephone Encounter (Signed)
Relevant patient education mailed to patient.  

## 2013-05-31 NOTE — Telephone Encounter (Signed)
error 

## 2013-06-02 ENCOUNTER — Other Ambulatory Visit: Payer: Medicare HMO

## 2013-06-02 DIAGNOSIS — R319 Hematuria, unspecified: Secondary | ICD-10-CM

## 2013-06-02 NOTE — Addendum Note (Signed)
Addended by: Peggyann Shoals on: 06/02/2013 01:21 PM   Modules accepted: Orders

## 2013-06-05 ENCOUNTER — Telehealth: Payer: Self-pay | Admitting: Internal Medicine

## 2013-06-05 NOTE — Telephone Encounter (Signed)
Patient recently seen with edema and difficulty urinating. Was seen last week,Proscar was started. I spoke with the patient's daughter 2 days ago, he is doing better.: Less difficulty urinating, decrease edema. She had microhematuria plan I recommended a urine culture. Seen by nephrology 05/24/2013,They recommended temporarily increase of Lasix to 2 edema. Hemoglobin was 10.8, creatinine 2 point, potassium 4.1, LFTs normal. Also, patient's daughter needs a FMLA.

## 2013-06-07 LAB — URINE CULTURE
Colony Count: NO GROWTH
Organism ID, Bacteria: NO GROWTH

## 2013-06-16 ENCOUNTER — Ambulatory Visit (INDEPENDENT_AMBULATORY_CARE_PROVIDER_SITE_OTHER): Payer: Medicare HMO | Admitting: Internal Medicine

## 2013-06-16 ENCOUNTER — Ambulatory Visit (HOSPITAL_BASED_OUTPATIENT_CLINIC_OR_DEPARTMENT_OTHER)
Admission: RE | Admit: 2013-06-16 | Discharge: 2013-06-16 | Disposition: A | Discharge status: Home / Self Care | Payer: Medicare HMO | Source: Ambulatory Visit | Attending: Internal Medicine | Admitting: Internal Medicine

## 2013-06-16 ENCOUNTER — Encounter: Payer: Self-pay | Admitting: Internal Medicine

## 2013-06-16 VITALS — BP 116/65 | HR 68 | Temp 98.5°F | Wt 170.0 lb

## 2013-06-16 DIAGNOSIS — R42 Dizziness and giddiness: Secondary | ICD-10-CM | POA: Insufficient documentation

## 2013-06-16 NOTE — Progress Notes (Signed)
Subjective:    Patient ID: Zachary Fields, male    DOB: 09/16/1945, 68 y.o.   MRN: [id number]  DOS:  06/16/2013 Type of  visit: Acute visit, here with his 2 granddaughters. Yesterday become quite dizzy, symptoms on and off, worse when he moved his head?Marland Kitchen The  granddaughter's report he was about to fall several times would run into walls. He also had nausea. Today he feels better but according to the family he is still stumbling.  Was seen recently with edema, Lasix was increased temporarily, reports is  better. Also had difficulty urinating, on Proscar.   ROS Denies fever or chills No chest pain, palpitations. No recent URI type of symptoms Denies   vomiting, diarrhea or blood in the stools. No headaches, slurred speech or facial numbness. He endorses diplopia which is not a new symptom for him  Past Medical History  Diagnosis Date  . Hypertension   . Anemia   . 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 CRI (chronic renal insufficiency)   . Depression   . Hyperhomocysteinemia   . 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 Vertell Limber in 1997. Shunt replaced 06-2008  . Dizziness     Chronic paresthesias and dizziness not related with stroke  . Renal cyst     08-2009 Renal cysts( R ) at outside hospital (seen also in a u/s 2007), [facility] for f/u u/s 11-11  . Deaf     L ear, poor hearing R   . 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 Diverticulosis   . Colon polyps   . 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 BPH (benign prostatic hyperplasia) 12/12/2010    Past Surgical History  Procedure Laterality Date  . Laparotomy      s/p removal of swallowed of a FB from the stomach when he was 68 y/o    . Brain surgery  several    s/p a VP shunt, revised  by Dr. Vertell Limber in 1997. Shunt replaced 06-2008.    History   Social History  . Marital Status: Divorced    Spouse Name: Zachary Fields      Number of Children: 2  . Years of Education: Zachary Fields   Occupational History  . accountant    Social History Main Topics  . Smoking status: Current Every Day Smoker -- 1.50 packs/day    Types: Cigarettes  . Smokeless tobacco: Never Used  . Alcohol Use: No     Comment: social/rare  . Drug Use: No  . Sexual Activity: Not on file   Other Topics Concern  . Not on file   Social History Narrative   Works in [place], 2 daughters (Zachary), lives w/  [name]         Medication List       This list is accurate as of: 06/16/13 11:59 PM.  Always use your most recent med list.               albuterol 108 (90 BASE) MCG/ACT inhaler  Commonly known as:  VENTOLIN HFA  Inhale 2 puffs into the lungs every 6 (six) hours as needed for wheezing or shortness of breath.     aspirin 325 MG tablet  Take 325 mg by mouth daily.  cyanocobalamin 1000 MCG/ML injection  Commonly known as:  (VITAMIN B-12)  Inject 1 mL (1,000 mcg total) into the muscle every 30 (thirty) days. Diagnosis 280.1     felodipine 10 MG 24 hr tablet  Commonly known as:  PLENDIL  Take 1 tablet (10 mg total) by mouth daily.     FOLIC ACID PO  Take 1 tablet by mouth daily.     furosemide 40 MG tablet  Commonly known as:  LASIX  TAKE ONE TABLET BY MOUTH ONCE DAILY     tamsulosin 0.4 MG Caps capsule  Commonly known as:  FLOMAX  TAKE ONE CAPSULE BY MOUTH EVERY DAY AFTER SUPPER           Objective:   Physical Exam BP 116/65  Pulse 68  Temp(Src) 98.5 F (36.9 C)  Wt 170 lb (77.111 kg)  SpO2 96% General -- alert, well-developed, NAD.   Neck--normal carotid pulses bilaterally HEENT-- Not pale.  Lungs -- normal respiratory effort, no intercostal retractions, no accessory muscle use, and normal breath sounds.  Heart-- normal rate, regular rhythm, no murmur.  Extremities-- +/+++ pretibial edema bilaterally  Neurologic--   alert & oriented X3. Speech a baseline, gait : small steps, unsteady (worse  than usual), symmetric but decreased arm swing; motor symmetric.  DTRs symmetric  EOMI  Psych-- Cognition and judgment appear intact. Cooperative with normal attention span and concentration. No anxious or depressed appearing.          Assessment & Plan:

## 2013-06-16 NOTE — Progress Notes (Signed)
Pre visit review using our clinic review tool, if applicable. No additional management support is needed unless otherwise documented below in the visit note. 

## 2013-06-16 NOTE — Patient Instructions (Signed)
Get a CT of the head today Do not drive We are referring you to neurology next week ER if symptoms severe, fever, chills, headaches, facial numbness, slurred speech or any stroke-like symptoms. Next visit to see me in one month

## 2013-06-16 NOTE — Assessment & Plan Note (Addendum)
Acute episode of dizziness and nausea. The patient has a long neurological history, exam however seems at baseline, DDX is large and includes a stroke, peripheral dizziness, shunt malfunction. I spokew/ Jenny Reichmann one of his daughters, we discussed ER eval-MRI- neuro consult today vs CT and neurology visit  next week. Is quite problematic MRI on the patient (will need neurosurgery consult per daughter) thus we agreed on a  CT. Recommend no driving, close observation by the family. Will also discontinue Proscar is the only new medication he is taking.

## 2013-06-18 ENCOUNTER — Other Ambulatory Visit: Payer: Self-pay | Admitting: Internal Medicine

## 2013-06-23 ENCOUNTER — Ambulatory Visit: Payer: Medicare HMO | Admitting: *Deleted

## 2013-06-23 ENCOUNTER — Ambulatory Visit: Payer: Medicare HMO

## 2013-06-23 DIAGNOSIS — E538 Deficiency of other specified B group vitamins: Secondary | ICD-10-CM

## 2013-06-23 MED ORDER — CYANOCOBALAMIN 1000 MCG/ML IJ SOLN
1000.0000 ug | Freq: Once | INTRAMUSCULAR | Status: DC
Start: 2013-06-23 — End: 2013-06-23

## 2013-07-21 ENCOUNTER — Ambulatory Visit (INDEPENDENT_AMBULATORY_CARE_PROVIDER_SITE_OTHER): Payer: Medicare HMO | Admitting: *Deleted

## 2013-07-21 DIAGNOSIS — E538 Deficiency of other specified B group vitamins: Secondary | ICD-10-CM

## 2013-07-21 MED ORDER — CYANOCOBALAMIN 1000 MCG/ML IJ SOLN
1000.0000 ug | Freq: Once | INTRAMUSCULAR | Status: AC
  Administered 2013-07-21: 1000 ug via INTRAMUSCULAR

## 2013-07-23 ENCOUNTER — Other Ambulatory Visit: Payer: Self-pay | Admitting: Internal Medicine

## 2013-08-23 ENCOUNTER — Ambulatory Visit: Payer: Medicare HMO

## 2013-08-25 ENCOUNTER — Ambulatory Visit: Payer: Medicare HMO | Admitting: Internal Medicine

## 2013-08-25 ENCOUNTER — Ambulatory Visit: Payer: Medicare HMO

## 2013-08-26 ENCOUNTER — Other Ambulatory Visit: Payer: Self-pay | Admitting: Internal Medicine

## 2013-09-01 ENCOUNTER — Ambulatory Visit: Payer: Medicare HMO

## 2013-09-29 ENCOUNTER — Ambulatory Visit: Payer: Medicare HMO

## 2013-09-29 DIAGNOSIS — E538 Deficiency of other specified B group vitamins: Secondary | ICD-10-CM

## 2013-09-29 MED ORDER — CYANOCOBALAMIN 1000 MCG/ML IJ SOLN: 1000.0000 ug | Freq: Once | INTRAMUSCULAR | Status: DC

## 2013-10-01 ENCOUNTER — Other Ambulatory Visit: Payer: Self-pay | Admitting: Internal Medicine

## 2013-10-10 ENCOUNTER — Other Ambulatory Visit: Payer: Self-pay | Admitting: Internal Medicine

## 2013-10-10 ENCOUNTER — Other Ambulatory Visit: Payer: Self-pay | Admitting: *Deleted

## 2013-10-10 ENCOUNTER — Encounter: Payer: Self-pay | Admitting: Internal Medicine

## 2013-10-10 MED ORDER — MECLIZINE HCL 12.5 MG PO TABS: 12.5000 mg | ORAL_TABLET | Freq: Four times a day (QID) | ORAL | Status: DC | PRN

## 2013-10-27 ENCOUNTER — Other Ambulatory Visit: Payer: Self-pay | Admitting: Internal Medicine

## 2013-10-27 ENCOUNTER — Ambulatory Visit (INDEPENDENT_AMBULATORY_CARE_PROVIDER_SITE_OTHER): Payer: Medicare HMO

## 2013-10-27 DIAGNOSIS — E538 Deficiency of other specified B group vitamins: Secondary | ICD-10-CM

## 2013-10-27 MED ORDER — CYANOCOBALAMIN 1000 MCG/ML IJ SOLN
1000.0000 ug | Freq: Once | INTRAMUSCULAR | Status: AC
  Administered 2013-10-27: 1000 ug via INTRAMUSCULAR

## 2013-11-16 ENCOUNTER — Encounter (HOSPITAL_COMMUNITY): Payer: Medicare HMO

## 2013-11-23 ENCOUNTER — Other Ambulatory Visit (HOSPITAL_COMMUNITY): Payer: Self-pay | Admitting: *Deleted

## 2013-11-24 ENCOUNTER — Ambulatory Visit: Payer: Medicare HMO

## 2013-11-24 ENCOUNTER — Ambulatory Visit (INDEPENDENT_AMBULATORY_CARE_PROVIDER_SITE_OTHER): Payer: Medicare HMO

## 2013-11-24 ENCOUNTER — Encounter (HOSPITAL_COMMUNITY)
Admission: RE | Admit: 2013-11-24 | Discharge: 2013-11-24 | Disposition: A | Discharge status: Home / Self Care | Payer: Medicare HMO | Source: Ambulatory Visit | Attending: Nephrology | Admitting: Nephrology

## 2013-11-24 DIAGNOSIS — D649 Anemia, unspecified: Secondary | ICD-10-CM | POA: Insufficient documentation

## 2013-11-24 DIAGNOSIS — E538 Deficiency of other specified B group vitamins: Secondary | ICD-10-CM

## 2013-11-24 LAB — POCT HEMOGLOBIN-HEMACUE: HEMOGLOBIN: 8.9 g/dL — AB (ref 13.0–17.0)

## 2013-11-24 MED ORDER — CYANOCOBALAMIN 1000 MCG/ML IJ SOLN
1000.0000 ug | Freq: Once | INTRAMUSCULAR | Status: AC
  Administered 2013-11-24: 1000 ug via INTRAMUSCULAR

## 2013-11-24 MED ORDER — DARBEPOETIN ALFA-POLYSORBATE 60 MCG/0.3ML IJ SOLN
60.0000 ug | INTRAMUSCULAR | Status: DC
  Administered 2013-11-24: 60 ug via SUBCUTANEOUS

## 2013-11-24 MED ORDER — DARBEPOETIN ALFA-POLYSORBATE 60 MCG/0.3ML IJ SOLN
INTRAMUSCULAR | Status: AC
  Filled 2013-11-24: qty 0.3

## 2013-11-24 NOTE — Discharge Instructions (Signed)
Darbepoetin Alfa injection What is this medicine? DARBEPOETIN ALFA (dar be POE e tin AL fa) helps your body make more red blood cells. It is used to treat anemia caused by chronic kidney failure and chemotherapy. This medicine may be used for other purposes; ask your health care provider or pharmacist if you have questions. COMMON BRAND NAME(S): Aranesp What should I tell my health care provider before I take this medicine? They need to know if you have any of these conditions: -blood clotting disorders or history of blood clots -cancer patient not on chemotherapy -cystic fibrosis -heart disease, such as angina, heart failure, or a history of a heart attack -hemoglobin level of 12 g/dL or greater -high blood pressure -low levels of folate, iron, or vitamin B12 -seizures -an unusual or allergic reaction to darbepoetin, erythropoietin, albumin, hamster proteins, latex, other medicines, foods, dyes, or preservatives -pregnant or trying to get pregnant -breast-feeding How should I use this medicine? This medicine is for injection into a vein or under the skin. It is usually given by a health care professional in a hospital or clinic setting. If you get this medicine at home, you will be taught how to prepare and give this medicine. Do not shake the solution before you withdraw a dose. Use exactly as directed. Take your medicine at regular intervals. Do not take your medicine more often than directed. It is important that you put your used needles and syringes in a special sharps container. Do not put them in a trash can. If you do not have a sharps container, call your pharmacist or healthcare provider to get one. Talk to your pediatrician regarding the use of this medicine in children. While this medicine may be used in children as young as 1 year for selected conditions, precautions do apply. Overdosage: If you think you have taken too much of this medicine contact a poison control center or  emergency room at once. NOTE: This medicine is only for you. Do not share this medicine with others. What if I miss a dose? If you miss a dose, take it as soon as you can. If it is almost time for your next dose, take only that dose. Do not take double or extra doses. What may interact with this medicine? Do not take this medicine with any of the following medications: -epoetin alfa This list may not describe all possible interactions. Give your health care provider a list of all the medicines, herbs, non-prescription drugs, or dietary supplements you use. Also tell them if you smoke, drink alcohol, or use illegal drugs. Some items may interact with your medicine. What should I watch for while using this medicine? Visit your prescriber or health care professional for regular checks on your progress and for the needed blood tests and blood pressure measurements. It is especially important for the doctor to make sure your hemoglobin level is in the desired range, to limit the risk of potential side effects and to give you the best benefit. Keep all appointments for any recommended tests. Check your blood pressure as directed. Ask your doctor what your blood pressure should be and when you should contact him or her. As your body makes more red blood cells, you may need to take iron, folic acid, or vitamin B supplements. Ask your doctor or health care provider which products are right for you. If you have kidney disease continue dietary restrictions, even though this medication can make you feel better. Talk with your doctor or health   care professional about the foods you eat and the vitamins that you take. What side effects may I notice from receiving this medicine? Side effects that you should report to your doctor or health care professional as soon as possible: -allergic reactions like skin rash, itching or hives, swelling of the face, lips, or tongue -breathing problems -changes in vision -chest  pain -confusion, trouble speaking or understanding -feeling faint or lightheaded, falls -high blood pressure -muscle aches or pains -pain, swelling, warmth in the leg -rapid weight gain -severe headaches -sudden numbness or weakness of the face, arm or leg -trouble walking, dizziness, loss of balance or coordination -seizures (convulsions) -swelling of the ankles, feet, hands -unusually weak or tired Side effects that usually do not require medical attention (report to your doctor or health care professional if they continue or are bothersome): -diarrhea -fever, chills (flu-like symptoms) -headaches -nausea, vomiting -redness, stinging, or swelling at site where injected This list may not describe all possible side effects. Call your doctor for medical advice about side effects. You may report side effects to FDA at 1-800-FDA-1088. Where should I keep my medicine? Keep out of the reach of children. Store in a refrigerator between 2 and 8 degrees C (36 and 46 degrees F). Do not freeze. Do not shake. Throw away any unused portion if using a single-dose vial. Throw away any unused medicine after the expiration date. NOTE: This sheet is a summary. It may not cover all possible information. If you have questions about this medicine, talk to your doctor, pharmacist, or health care provider.  2015, Elsevier/Gold Standard. (2008-02-07 10:23:57)  

## 2013-12-05 ENCOUNTER — Ambulatory Visit (INDEPENDENT_AMBULATORY_CARE_PROVIDER_SITE_OTHER): Payer: Medicare HMO | Admitting: Internal Medicine

## 2013-12-05 ENCOUNTER — Encounter: Payer: Self-pay | Admitting: Internal Medicine

## 2013-12-05 VITALS — BP 128/62 | HR 78 | Temp 97.8°F

## 2013-12-05 DIAGNOSIS — J40 Bronchitis, not specified as acute or chronic: Secondary | ICD-10-CM

## 2013-12-05 DIAGNOSIS — D649 Anemia, unspecified: Secondary | ICD-10-CM

## 2013-12-05 MED ORDER — AMOXICILLIN 500 MG PO CAPS: 1000.0000 mg | ORAL_CAPSULE | Freq: Two times a day (BID) | ORAL | Status: DC

## 2013-12-05 NOTE — Progress Notes (Signed)
Subjective:    Patient ID: Zachary Fields, male    DOB: Aug 12, 1945, 68 y.o.   MRN: [id number]  DOS:  12/05/2013 Type of visit - description : acute  Interval history: Symptoms started 3 weeks ago with increased cough from baseline, infrequently he sees some sputum, color?. Mild fever the last 2 nights, none today. Also, recently told that nephrology he has worsening anemia.  ROS Denies sore throat, sinus pain or congestion. No chest pain or difficulty breathing, dyspnea on exertion is at baseline. Mild wheezing.  Past Medical History  Diagnosis Date  . Hypertension   . Anemia   . 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 CRI (chronic renal insufficiency)   . Depression   . Hyperhomocysteinemia   . 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 Vertell Limber in 1997. Shunt replaced 06-2008  . Dizziness     Chronic paresthesias and dizziness not related with stroke  . Renal cyst     08-2009 Renal cysts( R ) at outside hospital (seen also in a u/s 2007), [facility] for f/u u/s 11-11  . Deaf     L ear, poor hearing R   . 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 Diverticulosis   . Colon polyps   . 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 BPH (benign prostatic hyperplasia) 12/12/2010    Past Surgical History  Procedure Laterality Date  . Laparotomy      s/p removal of swallowed of a FB from the stomach when he was 68 y/o    . Brain surgery  several    s/p a VP shunt, revised  by Dr. Vertell Limber in 1997. Shunt replaced 06-2008.    History   Social History  . Marital Status: Divorced    Spouse Name: N/A    Number of Children: 2  . Years of Education: N/A   Occupational History  . accountant    Social History Main Topics  . Smoking status: Current Every Day Smoker -- 1.50 packs/day    Types: Cigarettes  . Smokeless tobacco: Never Used  . Alcohol Use: No     Comment: social/rare  .  Drug Use: No  . Sexual Activity: Not on file   Other Topics Concern  . Not on file   Social History Narrative   Works in [place], 2 daughters ([name]), lives w/  [name]         Medication List       This list is accurate as of: 12/05/13  6:22 PM.  Always use your most recent med list.               albuterol 108 (90 BASE) MCG/ACT inhaler  Commonly known as:  VENTOLIN HFA  Inhale 2 puffs into the lungs every 6 (six) hours as needed for wheezing or shortness of breath.     amoxicillin 500 MG capsule  Commonly known as:  AMOXIL  Take 2 capsules (1,000 mg total) by mouth 2 (two) times daily.     aspirin 325 MG tablet  Take 325 mg by mouth daily.     cyanocobalamin 1000 MCG/ML injection  Commonly known as:  (VITAMIN B-12)  Inject 1 mL (1,000 mcg total) into the muscle every 30 (thirty) days. Diagnosis 280.1  felodipine 10 MG 24 hr tablet  Commonly known as:  PLENDIL  Take 1 tablet (10 mg total) by mouth daily.     FOLIC ACID PO  Take 1 tablet by mouth daily.     furosemide 40 MG tablet  Commonly known as:  LASIX  TAKE ONE TABLET BY MOUTH ONCE DAILY     meclizine 12.5 MG tablet  Commonly known as:  ANTIVERT  Take 1 tablet (12.5 mg total) by mouth 4 (four) times daily as needed for dizziness.     tamsulosin 0.4 MG Caps capsule  Commonly known as:  FLOMAX  TAKE ONE CAPSULE BY MOUTH EVERY DAY AFTER SUPPER           Objective:   Physical Exam BP 128/62  Pulse 78  Temp(Src) 97.8 F (36.6 C) (Oral)  SpO2 95% General -- alert, well-developed, NAD.   R Ear-- TMs obscured by wax L ear-- normal Throat symmetric, no redness or discharge. Face symmetric, sinuses not tender to palpation. Nose slt  congested.  Lungs -- normal respiratory effort, no intercostal retractions, no accessory muscle use, and  B  rhonchi that clears with cough. No wheezing  Heart-- normal rate, regular rhythm, no murmur.   Extremities-- no pretibial edema bilaterally    Neurologic--  alert & oriented X3.   No anxious or depressed appearing.        Assessment & Plan:   Bronchitis, Symptoms consistent with bronchitis, will treat with amoxicillin. See instructions  Anemia, Reports he recently was seen at nephrology,  Anemia was worse, will get records

## 2013-12-05 NOTE — Patient Instructions (Signed)
Rest, fluids , tylenol For cough, take Robitussin DM   as needed  If nasal  congestion use OTC Nasocort: 2 nasal sprays on each side of the nose daily until you feel better   Take the antibiotic as prescribed  (Amoxicillin) Call if not gradually better over the next 3-4 days  Call anytime if the symptoms are severe,

## 2013-12-05 NOTE — Progress Notes (Signed)
Pre visit review using our clinic review tool, if applicable. No additional management support is needed unless otherwise documented below in the visit note. 

## 2013-12-06 ENCOUNTER — Telehealth: Payer: Self-pay | Admitting: Internal Medicine

## 2013-12-06 NOTE — Telephone Encounter (Signed)
emmi emailed °

## 2013-12-07 ENCOUNTER — Other Ambulatory Visit (HOSPITAL_COMMUNITY): Payer: Self-pay

## 2013-12-08 ENCOUNTER — Encounter (HOSPITAL_COMMUNITY)
Admission: RE | Admit: 2013-12-08 | Discharge: 2013-12-08 | Disposition: A | Discharge status: Home / Self Care | Payer: Medicare HMO | Source: Ambulatory Visit | Attending: Nephrology | Admitting: Nephrology

## 2013-12-08 DIAGNOSIS — N183 Chronic kidney disease, stage 3 (moderate): Secondary | ICD-10-CM | POA: Insufficient documentation

## 2013-12-08 DIAGNOSIS — D631 Anemia in chronic kidney disease: Secondary | ICD-10-CM | POA: Diagnosis present

## 2013-12-08 LAB — COMPREHENSIVE METABOLIC PANEL
ALT: 9 U/L (ref 0–53)
AST: 13 U/L (ref 0–37)
Albumin: 3.8 g/dL (ref 3.5–5.2)
Alkaline Phosphatase: 111 U/L (ref 39–117)
Anion gap: 15 (ref 5–15)
BUN: 23 mg/dL (ref 6–23)
CO2: 24 meq/L (ref 19–32)
CREATININE: 2.08 mg/dL — AB (ref 0.50–1.35)
Calcium: 8.9 mg/dL (ref 8.4–10.5)
Chloride: 102 mEq/L (ref 96–112)
GFR, EST AFRICAN AMERICAN: 36 mL/min — AB (ref >90)
GFR, EST NON AFRICAN AMERICAN: 31 mL/min — AB (ref >90)
Glucose, Bld: 117 mg/dL — ABNORMAL HIGH (ref 70–99)
Potassium: 3.6 mEq/L — ABNORMAL LOW (ref 3.7–5.3)
Sodium: 141 mEq/L (ref 137–147)
TOTAL PROTEIN: 7.3 g/dL (ref 6.0–8.3)
Total Bilirubin: 0.3 mg/dL (ref 0.3–1.2)

## 2013-12-08 LAB — CBC
HCT: 28.8 % — ABNORMAL LOW (ref 39.0–52.0)
Hemoglobin: 8.8 g/dL — ABNORMAL LOW (ref 13.0–17.0)
MCH: 22.4 pg — AB (ref 26.0–34.0)
MCHC: 30.6 g/dL (ref 30.0–36.0)
MCV: 73.5 fL — AB (ref 78.0–100.0)
Platelets: 294 10*3/uL (ref 150–400)
RBC: 3.92 MIL/uL — AB (ref 4.22–5.81)
RDW: 15.7 % — ABNORMAL HIGH (ref 11.5–15.5)
WBC: 7.1 10*3/uL (ref 4.0–10.5)

## 2013-12-08 LAB — POCT HEMOGLOBIN-HEMACUE: Hemoglobin: 8.5 g/dL — ABNORMAL LOW (ref 13.0–17.0)

## 2013-12-08 MED ORDER — DARBEPOETIN ALFA-POLYSORBATE 60 MCG/0.3ML IJ SOLN
60.0000 ug | INTRAMUSCULAR | Status: DC
  Administered 2013-12-08: 60 ug via SUBCUTANEOUS

## 2013-12-08 MED ORDER — DARBEPOETIN ALFA-POLYSORBATE 60 MCG/0.3ML IJ SOLN
INTRAMUSCULAR | Status: AC
  Filled 2013-12-08: qty 0.3

## 2013-12-08 NOTE — Progress Notes (Signed)
Unable to find a test matching UPEP in lab database. Called [facility] for assistance. Unable to match code. Referred to [facility]. Also unable to find verbiage to match. Patient just voided and is unable to provide a specimen. [name], RN called [facility] and left message for Crystal to call back for assistance.

## 2013-12-11 ENCOUNTER — Telehealth: Payer: Self-pay | Admitting: Internal Medicine

## 2013-12-11 LAB — FERRITIN: Ferritin: 2 ng/mL — ABNORMAL LOW (ref 22–322)

## 2013-12-11 LAB — IRON AND TIBC
IRON: 27 ug/dL — AB (ref 42–135)
Saturation Ratios: 5 % — ABNORMAL LOW (ref 20–55)
TIBC: 503 ug/dL — AB (ref 215–435)
UIBC: 476 ug/dL — AB (ref 125–400)

## 2013-12-11 MED ORDER — ALBUTEROL SULFATE HFA 108 (90 BASE) MCG/ACT IN AERS: 2.0000 | INHALATION_SPRAY | Freq: Four times a day (QID) | RESPIRATORY_TRACT | Status: DC | PRN

## 2013-12-11 MED ORDER — AZITHROMYCIN 250 MG PO TABS: ORAL_TABLET | ORAL | Status: DC

## 2013-12-11 NOTE — Telephone Encounter (Signed)
LMOM for Pt informing him inhaler was sent to [place].

## 2013-12-11 NOTE — Telephone Encounter (Signed)
Caller name: Zachary Fields Relation to pt: self Call back number: [phone] [facility]: [facility] in [place]  Reason for call:   Patient states that he was seen last Friday and the medication that was prescribed to him is not working. He would like to know if something else could be called in

## 2013-12-11 NOTE — Telephone Encounter (Signed)
Pt called, he was given amoxicillin last OV, he is not feeling any better and wants to know if he can be prescribed something else. Please advise.

## 2013-12-11 NOTE — Telephone Encounter (Signed)
Spoke with Pt and gave him directions on how to take Z-pack, Pts wife is requesting an inhaler for Pt to take also.

## 2013-12-11 NOTE — Telephone Encounter (Signed)
Albuterol inhaler sent to [place].

## 2013-12-11 NOTE — Telephone Encounter (Signed)
has albuterol in his medication list, okay to send a refill if needed

## 2013-12-11 NOTE — Telephone Encounter (Signed)
Discontinue amoxicillin, I sent a prescription for Zithromax

## 2013-12-12 LAB — IMMUNOFIXATION ELECTROPHORESIS
IGA: 185 mg/dL (ref 68–379)
IGG (IMMUNOGLOBIN G), SERUM: 644 mg/dL — AB (ref 650–1600)
IgM, Serum: 44 mg/dL — ABNORMAL LOW (ref 41–251)
Total Protein ELP: 6.5 g/dL (ref 6.0–8.3)

## 2013-12-13 LAB — PROTEIN ELECTROPHORESIS, SERUM
ALPHA-1-GLOBULIN: 6.5 % — AB (ref 2.9–4.9)
Albumin ELP: 57.6 % (ref 55.8–66.1)
Alpha-2-Globulin: 13.3 % — ABNORMAL HIGH (ref 7.1–11.8)
Beta 2: 4.1 % (ref 3.2–6.5)
Beta Globulin: 9.1 % — ABNORMAL HIGH (ref 4.7–7.2)
Gamma Globulin: 9.4 % — ABNORMAL LOW (ref 11.1–18.8)
M-Spike, %: NOT DETECTED g/dL
TOTAL PROTEIN ELP: 6.5 g/dL (ref 6.0–8.3)

## 2013-12-15 ENCOUNTER — Encounter (HOSPITAL_COMMUNITY)
Admission: RE | Admit: 2013-12-15 | Discharge: 2013-12-15 | Disposition: A | Discharge status: Home / Self Care | Payer: Medicare HMO | Source: Ambulatory Visit | Attending: Nephrology | Admitting: Nephrology

## 2013-12-15 DIAGNOSIS — D631 Anemia in chronic kidney disease: Secondary | ICD-10-CM | POA: Diagnosis not present

## 2013-12-15 MED ORDER — SODIUM CHLORIDE 0.9 % IV SOLN
1020.0000 mg | Freq: Once | INTRAVENOUS | Status: AC
  Administered 2013-12-15: 1020 mg via INTRAVENOUS
  Filled 2013-12-15: qty 34

## 2013-12-15 NOTE — Discharge Instructions (Signed)

## 2013-12-22 ENCOUNTER — Encounter (HOSPITAL_COMMUNITY)
Admission: RE | Admit: 2013-12-22 | Discharge: 2013-12-22 | Disposition: A | Discharge status: Home / Self Care | Payer: Medicare HMO | Source: Ambulatory Visit | Attending: Nephrology | Admitting: Nephrology

## 2013-12-22 DIAGNOSIS — D631 Anemia in chronic kidney disease: Secondary | ICD-10-CM | POA: Diagnosis not present

## 2013-12-22 LAB — POCT HEMOGLOBIN-HEMACUE: Hemoglobin: 9.9 g/dL — ABNORMAL LOW (ref 13.0–17.0)

## 2013-12-22 MED ORDER — DARBEPOETIN ALFA-POLYSORBATE 60 MCG/0.3ML IJ SOLN
INTRAMUSCULAR | Status: AC
  Filled 2013-12-22: qty 0.3

## 2013-12-22 MED ORDER — DARBEPOETIN ALFA-POLYSORBATE 60 MCG/0.3ML IJ SOLN
60.0000 ug | INTRAMUSCULAR | Status: DC
  Administered 2013-12-22: 60 ug via SUBCUTANEOUS

## 2013-12-27 ENCOUNTER — Ambulatory Visit (INDEPENDENT_AMBULATORY_CARE_PROVIDER_SITE_OTHER): Payer: Medicare HMO | Admitting: Physician Assistant

## 2013-12-27 ENCOUNTER — Encounter: Payer: Self-pay | Admitting: Physician Assistant

## 2013-12-27 VITALS — BP 119/68 | HR 84 | Temp 98.4°F | Resp 16 | Ht 68.0 in | Wt 164.2 lb

## 2013-12-27 DIAGNOSIS — R2681 Unsteadiness on feet: Secondary | ICD-10-CM

## 2013-12-27 DIAGNOSIS — H6983 Other specified disorders of Eustachian tube, bilateral: Secondary | ICD-10-CM

## 2013-12-27 DIAGNOSIS — H6503 Acute serous otitis media, bilateral: Secondary | ICD-10-CM

## 2013-12-27 DIAGNOSIS — H6500 Acute serous otitis media, unspecified ear: Secondary | ICD-10-CM | POA: Insufficient documentation

## 2013-12-27 DIAGNOSIS — M5431 Sciatica, right side: Secondary | ICD-10-CM | POA: Insufficient documentation

## 2013-12-27 DIAGNOSIS — R42 Dizziness and giddiness: Secondary | ICD-10-CM

## 2013-12-27 MED ORDER — FLUTICASONE PROPIONATE 50 MCG/ACT NA SUSP: 1.0000 | Freq: Every day | NASAL | Status: DC

## 2013-12-27 MED ORDER — METHYLPREDNISOLONE (PAK) 4 MG PO TABS: ORAL_TABLET | ORAL | Status: DC

## 2013-12-27 NOTE — Progress Notes (Signed)
Pre visit review using our clinic review tool, if applicable. No additional management support is needed unless otherwise documented below in the visit note/SLS  

## 2013-12-27 NOTE — Assessment & Plan Note (Signed)
Referral to Neurology placed per patient and daughter's request.  Also recommended PT.  They will give this some thought.

## 2013-12-27 NOTE — Progress Notes (Signed)
Patient presents to clinic today c/o lightheadedness and dizziness x 1 day.  Endorses some fatigue.  Denies syncope, chest pain or palpitations.  Denies headache, vision changes or change to hearing.  No symptoms at present. Patient had CT head in 06/2013 without acute findings.   Patient also c/o flare up of his sciatica over the past few weeks.  Denies changes to bowel or bladder habits. Has history of BPH.  Currently on Flomax.  Last urination this morning.  Last bowel movement yesterday.   Daughter requesting referral to Neurology for gait issues giving patient's history. Patient is amenable to this.  Past Medical History  Diagnosis Date  . Hypertension   . Anemia   . 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 CRI (chronic renal insufficiency)   . Depression   . Hyperhomocysteinemia   . 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 Vertell Limber in 1997. Shunt replaced 06-2008  . Dizziness     Chronic paresthesias and dizziness not related with stroke  . Renal cyst     08-2009 Renal cysts( R ) at outside hospital (seen also in a u/s 2007), [facility] for f/u u/s 11-11  . Deaf     L ear, poor hearing R   . 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 Diverticulosis   . Colon polyps   . 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 BPH (benign prostatic hyperplasia) 12/12/2010    Current Outpatient Prescriptions on File Prior to Visit  Medication Sig Dispense Refill  . aspirin 325 MG tablet Take 325 mg by mouth daily.        . cyanocobalamin (,VITAMIN B-12,) 1000 MCG/ML injection Inject 1 mL (1,000 mcg total) into the muscle every 30 (thirty) days. Diagnosis 280.1  10 mL  3  . felodipine (PLENDIL) 10 MG 24 hr tablet Take 1 tablet (10 mg total) by mouth daily.  30 tablet  6  . FOLIC ACID PO Take 1 tablet by mouth daily.      . furosemide (LASIX) 40 MG tablet TAKE ONE TABLET BY MOUTH ONCE DAILY  30  tablet  12  . tamsulosin (FLOMAX) 0.4 MG CAPS capsule TAKE ONE CAPSULE BY MOUTH EVERY DAY AFTER SUPPER  90 capsule  1  . meclizine (ANTIVERT) 12.5 MG tablet Take 1 tablet (12.5 mg total) by mouth 4 (four) times daily as needed for dizziness.  60 tablet  0   Current Facility-Administered Medications on File Prior to Visit  Medication Dose Route Frequency Provider Last Rate Last Dose  . cyanocobalamin ((VITAMIN B-12)) injection 1,000 mcg  1,000 mcg Intramuscular Once Colon Branch, MD        Allergies  Allergen Reactions  . Varenicline Tartrate Other (See Comments)    Chantix=pt denies    Family History  Problem Relation Age of Onset  . Colon cancer Mother 44    colon  . Cancer Brother     bladder  . Prostate cancer Neg Hx   . Diabetes Neg Hx   . Stroke Neg Hx   . Coronary artery disease      Uncle MI at age 21s    History   Social History  . Marital Status: Divorced    Spouse Name: N/A    Number of  Children: 2  . Years of Education: N/A   Occupational History  . accountant    Social History Main Topics  . Smoking status: Current Every Day Smoker -- 1.50 packs/day    Types: Cigarettes  . Smokeless tobacco: Never Used  . Alcohol Use: No     Comment: social/rare  . Drug Use: No  . Sexual Activity: None   Other Topics Concern  . None   Social History Narrative   Works in [place], 2 daughters ([place]), lives w/  [name]    Review of Systems - See HPI.  All other ROS are negative.  BP 119/68  Pulse 84  Temp(Src) 98.4 F (36.9 C) (Oral)  Resp 16  Ht _0  (1.727 m)  Wt 164 lb 4 oz (74.503 kg)  BMI 24.98 kg/m2  SpO2 100%  Physical Exam  Vitals reviewed. Constitutional: He is oriented to person, place, and time and well-developed, well-nourished, and in no distress.  HENT:  Head: Normocephalic and atraumatic.  Right Ear: External ear and ear canal normal. A middle ear effusion is present.  Left Ear: External ear and ear canal normal. A middle ear  effusion is present.  Nose: Nose normal. Right sinus exhibits no maxillary sinus tenderness and no frontal sinus tenderness. Left sinus exhibits no maxillary sinus tenderness and no frontal sinus tenderness.  Mouth/Throat: Uvula is midline, oropharynx is clear and moist and mucous membranes are normal.  Cardiovascular: Normal rate, regular rhythm, normal heart sounds and intact distal pulses.   Pulmonary/Chest: Effort normal and breath sounds normal. No respiratory distress. He has no wheezes. He has no rales. He exhibits no tenderness.  Musculoskeletal:       Lumbar back: He exhibits pain. He exhibits normal range of motion, no tenderness, no spasm and normal pulse.  Neurological: He is alert and oriented to person, place, and time. He has normal sensation and intact cranial nerves. GCS score is 15.  Skin: Skin is warm and dry. No rash noted.  Psychiatric: Affect normal.    Recent Results (from the past 2160 hour(s))  POCT HEMOGLOBIN-HEMACUE     Status: Abnormal   Collection Time    11/24/13  3:23 PM      Result Value Ref Range   Hemoglobin 8.9 (*) 13.0 - 17.0 g/dL  IRON AND TIBC     Status: Abnormal   Collection Time    12/08/13  1:51 PM      Result Value Ref Range   Iron 27 (*) 42 - 135 ug/dL   TIBC 503 (*) 215 - 435 ug/dL   Saturation Ratios 5 (*) 20 - 55 %   UIBC 476 (*) 125 - 400 ug/dL   Comment: Performed at [facility]     Status: Abnormal   Collection Time    12/08/13  1:51 PM      Result Value Ref Range   Ferritin 2 (*) 22 - 322 ng/mL   Comment: Performed at [facility]  CBC     Status: Abnormal   Collection Time    12/08/13  1:51 PM      Result Value Ref Range   WBC 7.1  4.0 - 10.5 K/uL   RBC 3.92 (*) 4.22 - 5.81 MIL/uL   Hemoglobin 8.8 (*) 13.0 - 17.0 g/dL   HCT 28.8 (*) 39.0 - 52.0 %   MCV 73.5 (*) 78.0 - 100.0 fL   MCH 22.4 (*) 26.0 - 34.0 pg   MCHC 30.6  30.0 - 36.0 g/dL   RDW 15.7 (*) 11.5 - 15.5 %   Platelets 294  150 - 400  K/uL  COMPREHENSIVE METABOLIC PANEL     Status: Abnormal   Collection Time    12/08/13  1:51 PM      Result Value Ref Range   Sodium 141  137 - 147 mEq/L   Potassium 3.6 (*) 3.7 - 5.3 mEq/L   Chloride 102  96 - 112 mEq/L   CO2 24  19 - 32 mEq/L   Glucose, Bld 117 (*) 70 - 99 mg/dL   BUN 23  6 - 23 mg/dL   Creatinine, Ser 2.08 (*) 0.50 - 1.35 mg/dL   Calcium 8.9  8.4 - 10.5 mg/dL   Total Protein 7.3  6.0 - 8.3 g/dL   Albumin 3.8  3.5 - 5.2 g/dL   AST 13  0 - 37 U/L   ALT 9  0 - 53 U/L   Alkaline Phosphatase 111  39 - 117 U/L   Total Bilirubin 0.3  0.3 - 1.2 mg/dL   GFR calc non Af Amer 31 (*) >90 mL/min   GFR calc Af Amer 36 (*) >90 mL/min   Comment: (NOTE)     The eGFR has been calculated using the CKD EPI equation.     This calculation has not been validated in all clinical situations.     eGFR's persistently <90 mL/min signify possible Chronic Kidney     Disease.   Anion gap 15  5 - 15  PROTEIN ELECTROPHORESIS, SERUM     Status: Abnormal   Collection Time    12/08/13  1:51 PM      Result Value Ref Range   Total Protein ELP 6.5  6.0 - 8.3 g/dL   Albumin ELP 57.6  55.8 - 66.1 %   Alpha-1-Globulin 6.5 (*) 2.9 - 4.9 %   Alpha-2-Globulin 13.3 (*) 7.1 - 11.8 %   Beta Globulin 9.1 (*) 4.7 - 7.2 %   Beta 2 4.1  3.2 - 6.5 %   Gamma Globulin 9.4 (*) 11.1 - 18.8 %   M-Spike, % NOT DETECTED     SPE Interp. (NOTE)     Comment: The possibility of a faint restricted band (s) cannot be completely     excluded in the BETA - 1 AND GAMMA region.      Suggest serum IFE if clinically indicated.                                (Lab will hold sample one week).   Comment (NOTE)     Comment: ---------------     Serum protein electrophoresis is a useful screening procedure in the     detection of various pathophysiologic states such as inflammation,     gammopathies, protein loss and other dysproteinemias.  Immunofixation     electrophoresis (IFE) is a more sensitive technique for the      identification of M-proteins found in patients with monoclonal     gammopathy of unknown significance (MGUS), amyloidosis, early or     treated myeloma or macroglobulinemia, solitary plasmacytoma or     extramedullary plasmacytoma.     Performed at [facility] HEMOGLOBIN-HEMACUE     Status: Abnormal   Collection Time    12/08/13  1:51 PM      Result Value Ref Range   Hemoglobin 8.5 (*) 13.0 -  17.0 g/dL  IMMUNOFIXATION ELECTROPHORESIS     Status: Abnormal   Collection Time    12/08/13  1:52 PM      Result Value Ref Range   Total Protein ELP 6.5  6.0 - 8.3 g/dL   IgG (Immunoglobin G), Serum 644 (*) 650 - 1600 mg/dL   IgA 185  68 - 379 mg/dL   IgM, Serum 44 (*) 41 - 251 mg/dL   Immunofix Electr Int (NOTE)     Comment: Area of slightly restricted mobility in the IgG and Lambda lanes.     Suggest repeat in 6-8 months, if clinically indicated.     Reviewed by Odis Hollingshead, MD, PhD, FCAP (Electronic Signature on     File)     Performed at [facility]  POCT HEMOGLOBIN-HEMACUE     Status: Abnormal   Collection Time    12/22/13  1:02 PM      Result Value Ref Range   Hemoglobin 9.9 (*) 13.0 - 17.0 g/dL    Assessment/Plan: Acute serous otitis media Moderate amount of fluid noted as cause of dizziness. No evidence of bacterial infection.  Rx Medrol dose pack (also for sciatica).  Daily Flonase.  Limit salt intake. Follow-up with PCP in 1 week.  Right sided sciatica Rest. Rx Medrol dose pack.  Avoid overexertion. Follow-up with PCP in 1 week.  Gait instability Referral to Neurology placed per patient and daughter's request.  Also recommended PT.  They will give this some thought.

## 2013-12-27 NOTE — Patient Instructions (Signed)
Please take steroid pack as directed. This will help with fluid behind the ears and the sciatica. Use Flonase daily.  Continue other medications as directed.  Avoid overexertion.  Use Tylenol for breakthrough pain.  Follow-up with Dr. Larose Kells in 1 week. You will be contacted by Neurology for your gait instability.

## 2013-12-27 NOTE — Assessment & Plan Note (Signed)
Moderate amount of fluid noted as cause of dizziness. No evidence of bacterial infection.  Rx Medrol dose pack (also for sciatica).  Daily Flonase.  Limit salt intake. Follow-up with PCP in 1 week.

## 2013-12-27 NOTE — Assessment & Plan Note (Signed)
Rest. Rx Medrol dose pack.  Avoid overexertion. Follow-up with PCP in 1 week.

## 2013-12-29 ENCOUNTER — Ambulatory Visit (INDEPENDENT_AMBULATORY_CARE_PROVIDER_SITE_OTHER): Payer: Medicare HMO

## 2013-12-29 DIAGNOSIS — E538 Deficiency of other specified B group vitamins: Secondary | ICD-10-CM

## 2013-12-29 MED ORDER — CYANOCOBALAMIN 1000 MCG/ML IJ SOLN
1000.0000 ug | Freq: Once | INTRAMUSCULAR | Status: AC
  Administered 2013-12-29: 1000 ug via INTRAMUSCULAR

## 2013-12-29 NOTE — Progress Notes (Signed)
Pre visit review using our clinic review tool, if applicable. No additional management support is needed unless otherwise documented below in the visit note. 

## 2013-12-29 NOTE — Progress Notes (Signed)
Pt tolerated injection well

## 2014-01-05 ENCOUNTER — Ambulatory Visit (HOSPITAL_COMMUNITY)
Admission: RE | Admit: 2014-01-05 | Discharge: 2014-01-05 | Disposition: A | Discharge status: Home / Self Care | Payer: Medicare HMO | Source: Ambulatory Visit | Attending: Nephrology | Admitting: Nephrology

## 2014-01-05 ENCOUNTER — Encounter: Payer: Self-pay | Admitting: Neurology

## 2014-01-05 ENCOUNTER — Ambulatory Visit (INDEPENDENT_AMBULATORY_CARE_PROVIDER_SITE_OTHER): Payer: Medicare HMO | Admitting: Neurology

## 2014-01-05 VITALS — BP 140/60 | HR 76 | Ht 69.0 in | Wt 165.0 lb

## 2014-01-05 DIAGNOSIS — R531 Weakness: Secondary | ICD-10-CM

## 2014-01-05 DIAGNOSIS — Z01812 Encounter for preprocedural laboratory examination: Secondary | ICD-10-CM | POA: Diagnosis not present

## 2014-01-05 DIAGNOSIS — M5441 Lumbago with sciatica, right side: Secondary | ICD-10-CM

## 2014-01-05 DIAGNOSIS — M6289 Other specified disorders of muscle: Secondary | ICD-10-CM

## 2014-01-05 DIAGNOSIS — M5416 Radiculopathy, lumbar region: Secondary | ICD-10-CM

## 2014-01-05 LAB — POCT HEMOGLOBIN-HEMACUE: HEMOGLOBIN: 12.9 g/dL — AB (ref 13.0–17.0)

## 2014-01-05 MED ORDER — DARBEPOETIN ALFA-POLYSORBATE 60 MCG/0.3ML IJ SOLN: 60.0000 ug | INTRAMUSCULAR | Status: DC

## 2014-01-05 NOTE — Progress Notes (Signed)
Zachary Fields was seen today in neurologic consultation at the request of Zachary November, MD.  The consultation is for the evaluation of gait changes and balance loss.  Pt reports that R sided weakness started after a stroke which was at least 2 years ago.  He states that sometimes the R side will unexpicably just "lose power" and just "give out."  He has not done any PT in the recent years.  He has had some near falls.  Pt has a hx of B12 deficiency and is receiving injections for that.  He reports LBP and "sciatica" and that has been bothering him but is unsure if that is the reason that the leg "gives out" but does state that he has significant intermittent LBP.  He has a hx of pineal tumor in the 1970s and is status post shunt, which was revised by Dr. Vertell Limber in 1997 and again replaced in April, 2010.  The patient is able to have MRIs, but states that the MRI does turn off the shunt and he has to have that turned on at Dr. Melven Sartorius office right after the MRI.     ALLERGIES:   Allergies  Allergen Reactions  . Varenicline Tartrate Other (See Comments)    Chantix=pt denies    CURRENT MEDICATIONS:  Outpatient Encounter Prescriptions as of 01/05/2014  Medication Sig  . aspirin 325 MG tablet Take 325 mg by mouth daily.    . cyanocobalamin (,VITAMIN B-12,) 1000 MCG/ML injection Inject 1 mL (1,000 mcg total) into the muscle every 30 (thirty) days. Diagnosis 280.1  . felodipine (PLENDIL) 10 MG 24 hr tablet Take 1 tablet (10 mg total) by mouth daily.  Marland Kitchen FOLIC ACID PO Take 1 tablet by mouth daily.  . furosemide (LASIX) 40 MG tablet TAKE ONE TABLET BY MOUTH ONCE DAILY  . tamsulosin (FLOMAX) 0.4 MG CAPS capsule TAKE ONE CAPSULE BY MOUTH EVERY DAY AFTER SUPPER  . [DISCONTINUED] fluticasone (FLONASE) 50 MCG/ACT nasal spray Place 1 spray into both nostrils daily.  . [DISCONTINUED] meclizine (ANTIVERT) 12.5 MG tablet Take 1 tablet (12.5 mg total) by mouth 4 (four) times daily as needed for dizziness.  .  [DISCONTINUED] methylPREDNIsolone (MEDROL DOSPACK) 4 MG tablet follow package directions    PAST MEDICAL HISTORY:   Past Medical History  Diagnosis Date  . Hypertension   . Anemia   . 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 CRI (chronic renal insufficiency)   . Depression   . Hyperhomocysteinemia   . 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 Vertell Limber in 1997. Shunt replaced 06-2008  . Dizziness     Chronic paresthesias and dizziness not related with stroke  . Renal cyst     08-2009 Renal cysts( R ) at outside hospital (seen also in a u/s 2007), [facility] for f/u u/s 11-11  . Deaf     L ear, poor hearing R   . 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 Diverticulosis   . Colon polyps   . 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 BPH (benign prostatic hyperplasia) 12/12/2010    PAST SURGICAL HISTORY:   Past Surgical History  Procedure Laterality Date  . Laparotomy      s/p removal of swallowed of a FB from the stomach when  he was 68 y/o    . Brain surgery  several    s/p a VP shunt, revised  by Dr. Vertell Limber in 1997. Shunt replaced 06-2008.    SOCIAL HISTORY:   History   Social History  . Marital Status: Divorced    Spouse Name: N/A    Number of Children: 2  . Years of Education: N/A   Occupational History  . accountant    Social History Main Topics  . Smoking status: Current Every Day Smoker -- 1.50 packs/day    Types: Cigarettes  . Smokeless tobacco: Never Used  . Alcohol Use: No  . Drug Use: No  . Sexual Activity: Not on file   Other Topics Concern  . Not on file   Social History Narrative   Works in [place], 2 daughters ([name]), lives w/  [name]     FAMILY HISTORY:   Family Status  Relation Status Death Age  . Mother Deceased     colon cancer  . Father Deceased     MVA  . Brother Alive     melanoma  . Sister Alive     ear problems  . Sister  Alive     healthy  . Daughter Alive     healthy  . Daughter Alive     back problems    ROS:  A complete 10 system review of systems was obtained and was unremarkable apart from what is mentioned above.  PHYSICAL EXAMINATION:    VITALS:   Filed Vitals:   01/05/14 0807  BP: 140/60  Pulse: 76  Height: 5\' 9"  (1.753 m)  Weight: 165 lb (74.844 kg)    GEN:  Normal appears male in no acute distress.  Appears stated age.  During one episode, he actually burst into tears. HEENT:  Normocephalic, atraumatic. The mucous membranes are moist. The superficial temporal arteries are without ropiness or tenderness. Cardiovascular: Regular rate and rhythm. Lungs: There are inspiratory wheezes Neck/Heme: There are no carotid bruits noted bilaterally.  NEUROLOGICAL: Orientation:  The patient is alert and oriented x 3.  Fund of knowledge is appropriate.  Recent and remote memory intact.  Attention span and concentration normal.  Repeats and names without difficulty. Cranial nerves: There is right facial droop. The pupils are equal round and reactive to light bilaterally. Fundoscopic exam   is attempted but the disc margins were not well visualized.Extraocular muscles are intact  but there is definitely end beating horizontal nystagmus but it is easily fatigable and visual fields are full to confrontational testing. Speech is fluent and clear. Soft palate rises symmetrically and there is no tongue deviation. Hearing is decreased to conversational tone. Tone: Tone is good throughout. Sensation: Sensation is intact to light touch and pinprick throughout (facial, trunk, extremities). Vibration is decreasedat the bilateral big toe. There is no extinction with double simultaneous stimulation. There is no sensory dermatomal level identified. Coordination:  The patient has no difficulty with RAM's or FNF bilaterally. Motor: Strength is 5/5 in the bilateral upper and lower extremities.  Shoulder shrug is equal and  symmetric. There is no pronator drift.  There are no fasciculations noted. DTR's: Deep tendon reflexes are 3/4 at the bilateral biceps, triceps, brachioradialis, patella and achilles.  Plantar responses are downgoing bilaterally. There is no Hoffmans or Tromner's response. Gait and Station: The patient is able to ambulate without difficulty for the large majority of the walks.  At the very end of the walk, when he goes to sit back in  the chair, he gets what he describes as a severe stabbing pain from the back that radiates into the right leg and he yells out in pain and states that his leg "gave out."   IMPRESSION/PLAN  1.  probable radiculopathy  -I suspect that the patient's symptoms are coming from his back as opposed to his old stroke.  He is actually very strong when tested and really quite symmetric besides for facial asymmetry.  However, as above, he does get a severe stabbing pain from the back that radiates into the leg when we were walking and reported that his leg and gave out.  Suspect that this is either radicular in nature or perhaps sciatica.  I would like to do an MRI of the lumbar spine.  I will go ahead and include an MRI of the brain given his history of multiple shunts and stroke.  I will coordinate this with Dr. Melven Sartorius office given the fact that MRIs tend to turn off his shunts.  I will actually try to do this over at Dr. Donald Pore mobile MRI unit.  If this does not show anything, then we can certainly consider EMG or even go to physical therapy, as he has not done this either.  Further recommendations will follow the above testing.  Total visit time was 45 minutes.

## 2014-01-10 ENCOUNTER — Other Ambulatory Visit: Payer: Self-pay | Admitting: Neurosurgery

## 2014-01-10 ENCOUNTER — Telehealth: Payer: Self-pay | Admitting: Neurology

## 2014-01-10 DIAGNOSIS — G930 Cerebral cysts: Secondary | ICD-10-CM

## 2014-01-10 NOTE — Telephone Encounter (Signed)
Spoke with Hildred Alamin with Dr Melven Sartorius office. Patient is set up for his MR Brain/Lumbar spine with Dr Vertell Limber to turn on shunt after on 01/17/14. Message sent to [facility] to get precertification.

## 2014-01-15 ENCOUNTER — Other Ambulatory Visit: Payer: Self-pay

## 2014-01-15 DIAGNOSIS — I10 Essential (primary) hypertension: Secondary | ICD-10-CM

## 2014-01-15 MED ORDER — FELODIPINE ER 10 MG PO TB24: 10.0000 mg | ORAL_TABLET | Freq: Every day | ORAL | Status: DC

## 2014-01-17 ENCOUNTER — Other Ambulatory Visit: Payer: Medicare HMO

## 2014-01-19 ENCOUNTER — Other Ambulatory Visit: Payer: Medicare HMO

## 2014-01-19 ENCOUNTER — Inpatient Hospital Stay (HOSPITAL_COMMUNITY): Admission: RE | Admit: 2014-01-19 | Payer: Medicare HMO | Source: Ambulatory Visit

## 2014-01-22 ENCOUNTER — Encounter: Payer: Self-pay | Admitting: Nurse Practitioner

## 2014-01-22 ENCOUNTER — Ambulatory Visit (INDEPENDENT_AMBULATORY_CARE_PROVIDER_SITE_OTHER): Payer: Medicare HMO | Admitting: Nurse Practitioner

## 2014-01-22 VITALS — BP 147/77 | HR 75 | Temp 97.9°F | Resp 18 | Ht 68.0 in | Wt 168.0 lb

## 2014-01-22 DIAGNOSIS — H9191 Unspecified hearing loss, right ear: Secondary | ICD-10-CM

## 2014-01-22 NOTE — Assessment & Plan Note (Signed)
New onset decreased right ear ability to hear. Deaf in left ear historically. His ear was irrigated and disimpacted for cerumen, which increased his ability to hear. Instructions given to call if not resolved or worsening. He was given home instructions for ear wax removal at home.

## 2014-01-22 NOTE — Patient Instructions (Addendum)
Call us if your hearing changes again. Nice to meet you!    Ceruminosis is noted.  Wax is removed by syringing and manual debridement. Instructions for home care to prevent wax buildup are given. Carbamide Peroxide ear solution What is this medicine? CARBAMIDE PEROXIDE (CAR bah mide per OX ide) is used to soften and help remove ear wax. This medicine may be used for other purposes; ask your health care provider or pharmacist if you have questions. COMMON BRAND NAME(S): Auro Ear, Auro Earache Relief, Debrox, Ear Drops, Ear Wax Removal, Ear Wax Remover, Earwax Treatment, Murine, Thera-Ear What should I tell my health care provider before I take this medicine? They need to know if you have any of these conditions: -dizziness -ear discharge -ear pain, irritation or rash -infection -perforated eardrum (hole in eardrum) -an unusual or allergic reaction to carbamide peroxide, glycerin, hydrogen peroxide, other medicines, foods, dyes, or preservatives -pregnant or trying to get pregnant -breast-feeding How should I use this medicine? This medicine is only for use in the outer ear canal. Follow the directions carefully. Wash hands before and after use. The solution may be warmed by holding the bottle in the hand for 1 to 2 minutes. Lie with the affected ear facing upward. Place the proper number of drops into the ear canal. After the drops are instilled, remain lying with the affected ear upward for 5 minutes to help the drops stay in the ear canal. A cotton ball may be gently inserted at the ear opening for no longer than 5 to 10 minutes to ensure retention. Repeat, if necessary, for the opposite ear. Do not touch the tip of the dropper to the ear, fingertips, or other surface. Do not rinse the dropper after use. Keep container tightly closed. Talk to your pediatrician regarding the use of this medicine in children. While this drug may be used in children as young as 12 years for selected conditions,  precautions do apply. Overdosage: If you think you have taken too much of this medicine contact a poison control center or emergency room at once. NOTE: This medicine is only for you. Do not share this medicine with others. What if I miss a dose? If you miss a dose, use it as soon as you can. If it is almost time for your next dose, use only that dose. Do not use double or extra doses. What may interact with this medicine? Interactions are not expected. Do not use any other ear products without asking your doctor or health care professional. This list may not describe all possible interactions. Give your health care provider a list of all the medicines, herbs, non-prescription drugs, or dietary supplements you use. Also tell them if you smoke, drink alcohol, or use illegal drugs. Some items may interact with your medicine. What should I watch for while using this medicine? This medicine is not for long-term use. Do not use for more than 4 days without checking with your health care professional. Contact your doctor or health care professional if your condition does not start to get better within a few days or if you notice burning, redness, itching or swelling. What side effects may I notice from receiving this medicine? Side effects that you should report to your doctor or health care professional as soon as possible: -allergic reactions like skin rash, itching or hives, swelling of the face, lips, or tongue -burning, itching, and redness -worsening ear pain -rash Side effects that usually do not require medical attention (report  to your doctor or health care professional if they continue or are bothersome): -abnormal sensation while putting the drops in the ear -temporary reduction in hearing (but not complete loss of hearing) This list may not describe all possible side effects. Call your doctor for medical advice about side effects. You may report side effects to FDA at 1-800-FDA-1088. Where  should I keep my medicine? Keep out of the reach of children. Store at room temperature between 15 and 30 degrees C (59 and 86 degrees F) in a tight, light-resistant container. Keep bottle away from excessive heat and direct sunlight. Throw away any unused medicine after the expiration date. NOTE: This sheet is a summary. It may not cover all possible information. If you have questions about this medicine, talk to your doctor, pharmacist, or health care provider.  2015, Elsevier/Gold Standard. (2007-06-07 14:00:02)

## 2014-01-22 NOTE — Progress Notes (Signed)
Pre visit review using our clinic review tool, if applicable. No additional management support is needed unless otherwise documented below in the visit note. 

## 2014-01-22 NOTE — Progress Notes (Signed)
Subjective:    Patient ID: Zachary Fields, male    DOB: [birth year], 68 y.o.   MRN: [id number]  HPI  Zachary Fields is a 68 yo male with decreased hearing in his right ear. He is deaf in left ear. He was evaluated by an audiologist many years ago, unsure of name or last visit.  1). Unable to hear well in right ear-  Onset- This morning Location- Right ear Duration- Since 7:30 this am Characteristics- pressure, sounds are increasing and decreasing, feels he sounds like he is in a box Aggravating factors- Coughing Relieving factors- None Severity- hearing decreased by about 40% he reports Tinnitus is not a new problem for him.   Whisper test- Unable to hear whispered words  Bronchitis resolved 2-3 weeks ago. No other viral symptoms since then.   Review of Systems Positive for decreased hearing in right ear, cough, and tinnitus Denies fever, ear pain, chills, sweats, viral symptoms Past Medical History  Diagnosis Date  . Hypertension   . Anemia   . 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 CRI (chronic renal insufficiency)   . Depression   . Hyperhomocysteinemia   . 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 Vertell Limber in 1997. Shunt replaced 06-2008  . Dizziness     Chronic paresthesias and dizziness not related with stroke  . Renal cyst     08-2009 Renal cysts( R ) at outside hospital (seen also in a u/s 2007), [facility] for f/u u/s 11-11  . Deaf     L ear, poor hearing R   . 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 Diverticulosis   . Colon polyps   . 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 BPH (benign prostatic hyperplasia) 12/12/2010    History   Social History  . Marital Status: Divorced    Spouse Name: N/A    Number of Children: 2  . Years of Education: N/A   Occupational History  . accountant    Social History Main Topics  . Smoking status: Current Every Day  Smoker -- 1.50 packs/day    Types: Cigarettes  . Smokeless tobacco: Never Used  . Alcohol Use: No  . Drug Use: No  . Sexual Activity: Not on file   Other Topics Concern  . Not on file   Social History Narrative   Works in [place], 2 daughters ([name]), lives w/  [name]     Past Surgical History  Procedure Laterality Date  . Laparotomy      s/p removal of swallowed of a FB from the stomach when he was 68 y/o    . Brain surgery  several    s/p a VP shunt, revised  by Dr. Vertell Limber in 1997. Shunt replaced 06-2008.    Family History  Problem Relation Age of Onset  . Colon cancer Mother 28    colon  . Cancer Brother     bladder  . Prostate cancer Neg Hx   . Diabetes Neg Hx   . Stroke Neg Hx   . Coronary artery disease      Uncle MI at age 33s    Allergies  Allergen Reactions  . Varenicline Tartrate Other (See Comments)    Chantix=pt denies    Current Outpatient Prescriptions on  File Prior to Visit  Medication Sig Dispense Refill  . aspirin 325 MG tablet Take 325 mg by mouth daily.      . cyanocobalamin (,VITAMIN B-12,) 1000 MCG/ML injection Inject 1 mL (1,000 mcg total) into the muscle every 30 (thirty) days. Diagnosis 280.1 10 mL 3  . felodipine (PLENDIL) 10 MG 24 hr tablet Take 1 tablet (10 mg total) by mouth daily. 30 tablet 6  . FOLIC ACID PO Take 1 tablet by mouth daily.    . furosemide (LASIX) 40 MG tablet TAKE ONE TABLET BY MOUTH ONCE DAILY 30 tablet 12  . tamsulosin (FLOMAX) 0.4 MG CAPS capsule TAKE ONE CAPSULE BY MOUTH EVERY DAY AFTER SUPPER 90 capsule 1   Current Facility-Administered Medications on File Prior to Visit  Medication Dose Route Frequency Provider Last Rate Last Dose  . cyanocobalamin ((VITAMIN B-12)) injection 1,000 mcg  1,000 mcg Intramuscular Once Colon Branch, MD        BP 147/77 mmHg  Pulse 75  Temp(Src) 97.9 F (36.6 C) (Oral)  Resp 18  Ht 5\' 8"  (1.727 m)  Wt 168 lb (76.204 kg)  BMI 25.55 kg/m2  SpO2 97%       Objective:    Physical Exam Constitutional- Well-nourished, not in distress.  Eyes/Face- Right side facial droop noted. Ear- Before irrigation of right ear wax is yellow, wet, and abundant in right ear canal obstructing the TM view. Canal is now clear and TM visualized without injection. Able to hear more clearly spoken words, still unable to hear a whisper.      Assessment & Plan:

## 2014-01-23 ENCOUNTER — Telehealth: Payer: Self-pay | Admitting: Neurology

## 2014-01-23 ENCOUNTER — Ambulatory Visit
Admission: RE | Admit: 2014-01-23 | Discharge: 2014-01-23 | Disposition: A | Discharge status: Home / Self Care | Payer: Medicare HMO | Source: Ambulatory Visit | Attending: Neurology | Admitting: Neurology

## 2014-01-23 DIAGNOSIS — R531 Weakness: Secondary | ICD-10-CM

## 2014-01-23 DIAGNOSIS — M5441 Lumbago with sciatica, right side: Secondary | ICD-10-CM

## 2014-01-23 DIAGNOSIS — M5416 Radiculopathy, lumbar region: Secondary | ICD-10-CM

## 2014-01-23 NOTE — Telephone Encounter (Signed)
-----   Message from Stiles, DO sent at 01/23/2014  3:20 PM EST ----- Luvenia Starch, can you let pt know that nothing new on MRI brain.  Why was this done without gad when ordered with?  Also, where is L-spine?

## 2014-01-23 NOTE — Telephone Encounter (Signed)
Called patient to let him know MR okay. He was at Dr Melven Sartorius office. I spoke with Dr Vertell Limber and he would like for Dr Tat to review brain images. He states that there is an increase signal in the brain stem and he thinks this is where his symptoms are coming from. No surgery to fix this. He states patient had MR brain, but then came to his office and had MR Lumbar Spine which was normal.  Dr Carles Collet - please advise.

## 2014-01-26 ENCOUNTER — Ambulatory Visit (INDEPENDENT_AMBULATORY_CARE_PROVIDER_SITE_OTHER): Payer: Medicare HMO

## 2014-01-26 ENCOUNTER — Encounter (HOSPITAL_COMMUNITY): Payer: Medicare HMO

## 2014-01-26 DIAGNOSIS — E538 Deficiency of other specified B group vitamins: Secondary | ICD-10-CM

## 2014-01-26 MED ORDER — CYANOCOBALAMIN 1000 MCG/ML IJ SOLN
1000.0000 ug | Freq: Once | INTRAMUSCULAR | Status: AC
  Administered 2014-01-26: 1000 ug via INTRAMUSCULAR

## 2014-01-26 NOTE — Progress Notes (Signed)
Pt tolerated injection well

## 2014-01-26 NOTE — Telephone Encounter (Signed)
I reviewed MRI.  Pt does have T2 hyperintensities across pons region but it is bilateral and wouldn't expect that to cause unilateral sx's.  Jade, schedule EMG if pt agreeable and also PT.

## 2014-01-26 NOTE — Progress Notes (Signed)
Pre visit review using our clinic review tool, if applicable. No additional management support is needed unless otherwise documented below in the visit note. 

## 2014-01-29 NOTE — Telephone Encounter (Signed)
Spoke with patient and he is agreeable to both. Orders placed for PT and EMG. He will be contacted with appts.

## 2014-01-29 NOTE — Addendum Note (Signed)
Addended by: Annamaria Helling on: 01/29/2014 09:43 AM   Modules accepted: Orders

## 2014-01-31 ENCOUNTER — Telehealth: Payer: Self-pay | Admitting: Neurology

## 2014-02-23 ENCOUNTER — Ambulatory Visit (INDEPENDENT_AMBULATORY_CARE_PROVIDER_SITE_OTHER): Payer: Medicare HMO

## 2014-02-23 DIAGNOSIS — E538 Deficiency of other specified B group vitamins: Secondary | ICD-10-CM

## 2014-02-23 MED ORDER — CYANOCOBALAMIN 1000 MCG/ML IJ SOLN
1000.0000 ug | Freq: Once | INTRAMUSCULAR | Status: AC
  Administered 2014-02-23: 1000 ug via INTRAMUSCULAR

## 2014-02-23 NOTE — Progress Notes (Signed)
Pre visit review using our clinic review tool, if applicable. No additional management support is needed unless otherwise documented below in the visit note. 

## 2014-02-23 NOTE — Progress Notes (Signed)
Pt tolerated injection well

## 2014-03-20 ENCOUNTER — Telehealth: Payer: Self-pay | Admitting: Neurology

## 2014-03-20 NOTE — Telephone Encounter (Signed)
Pt called to cancel EMG study. He was a bit confused as to why he was getting this done. He has decided to hold off for now. Appt has been cancelled / [facility]

## 2014-03-21 ENCOUNTER — Telehealth: Payer: Self-pay | Admitting: Neurology

## 2014-03-21 NOTE — Telephone Encounter (Signed)
Estill Bamberg, pt's daughter called to r/s his 63 MINS EMG for another Friday since Friday is the only day he can come due to him being off work. Can we work the pt in?  C/b [phone]

## 2014-03-23 ENCOUNTER — Ambulatory Visit (INDEPENDENT_AMBULATORY_CARE_PROVIDER_SITE_OTHER): Payer: Medicare HMO

## 2014-03-23 ENCOUNTER — Encounter: Payer: Medicare HMO | Admitting: Neurology

## 2014-03-23 DIAGNOSIS — E538 Deficiency of other specified B group vitamins: Secondary | ICD-10-CM

## 2014-03-23 MED ORDER — CYANOCOBALAMIN 1000 MCG/ML IJ SOLN
1000.0000 ug | Freq: Once | INTRAMUSCULAR | Status: AC
  Administered 2014-03-23: 1000 ug via INTRAMUSCULAR

## 2014-03-23 NOTE — Progress Notes (Signed)
Pre visit review using our clinic review tool, if applicable. No additional management support is needed unless otherwise documented below in the visit note. Patient tolerated well.  

## 2014-04-02 ENCOUNTER — Other Ambulatory Visit: Payer: Self-pay | Admitting: Physician Assistant

## 2014-04-02 NOTE — Telephone Encounter (Signed)
Rx request to pharmacy/[facility]  

## 2014-04-17 NOTE — Telephone Encounter (Signed)
error 

## 2014-04-20 ENCOUNTER — Ambulatory Visit (INDEPENDENT_AMBULATORY_CARE_PROVIDER_SITE_OTHER): Payer: Medicare HMO | Admitting: *Deleted

## 2014-04-20 DIAGNOSIS — E538 Deficiency of other specified B group vitamins: Secondary | ICD-10-CM

## 2014-04-20 MED ORDER — CYANOCOBALAMIN 1000 MCG/ML IJ SOLN
1000.0000 ug | Freq: Once | INTRAMUSCULAR | Status: AC
  Administered 2014-04-20: 1000 ug via INTRAMUSCULAR

## 2014-04-20 NOTE — Progress Notes (Signed)
Pre visit review using our clinic review tool, if applicable. No additional management support is needed unless otherwise documented below in the visit note.  Patient tolerated injection well.  Next injection scheduled for 05/18/14. 

## 2014-04-27 ENCOUNTER — Encounter: Payer: Self-pay | Admitting: Nurse Practitioner

## 2014-04-27 ENCOUNTER — Ambulatory Visit (INDEPENDENT_AMBULATORY_CARE_PROVIDER_SITE_OTHER): Payer: Medicare HMO | Admitting: Nurse Practitioner

## 2014-04-27 VITALS — BP 108/60 | HR 76 | Temp 97.6°F | Ht 68.0 in | Wt 168.0 lb

## 2014-04-27 DIAGNOSIS — J189 Pneumonia, unspecified organism: Secondary | ICD-10-CM

## 2014-04-27 MED ORDER — ALBUTEROL SULFATE HFA 108 (90 BASE) MCG/ACT IN AERS: INHALATION_SPRAY | RESPIRATORY_TRACT | Status: AC

## 2014-04-27 MED ORDER — DOXYCYCLINE HYCLATE 100 MG PO TABS: 100.0000 mg | ORAL_TABLET | Freq: Two times a day (BID) | ORAL | Status: DC

## 2014-04-27 NOTE — Progress Notes (Signed)
Pre visit review using our clinic review tool, if applicable. No additional management support is needed unless otherwise documented below in the visit note. 

## 2014-04-27 NOTE — Patient Instructions (Signed)
Please start antibiotic. Eat before you take it, to minimize nausea.  Use inhaler twice daily for 4-5 days, then every 6 hours as needed for cough.  Return in 2 weeks or sooner if you feel worse.

## 2014-04-27 NOTE — Progress Notes (Signed)
   Subjective:    Patient ID: Zachary Fields, male    DOB: 1945/03/20, 69 y.o.   MRN: [id number]  Cough This is a new problem. The current episode started yesterday. The problem has been rapidly worsening. The problem occurs every few hours. The cough is productive of sputum. Associated symptoms include chills, a fever, myalgias, nasal congestion and shortness of breath. Pertinent negatives include no chest pain, ear pain, headaches, hemoptysis, sore throat or wheezing. Nothing aggravates the symptoms. Risk factors for lung disease include smoking/tobacco exposure (stage 3 CKD). He has tried nothing for the symptoms.      Review of Systems  Constitutional: Positive for fever and chills.  HENT: Negative for ear pain and sore throat.   Respiratory: Positive for cough, chest tightness and shortness of breath. Negative for hemoptysis and wheezing.   Cardiovascular: Negative for chest pain.  Gastrointestinal: Negative for nausea and diarrhea.  Musculoskeletal: Positive for myalgias.  Neurological: Negative for headaches.       Objective:   Physical Exam  Constitutional: He is oriented to person, place, and time. He appears well-developed and well-nourished.  HENT:  Head: Normocephalic and atraumatic.  Right Ear: External ear normal.  Left Ear: External ear normal.  Mouth/Throat: Oropharynx is clear and moist. No oropharyngeal exudate.  Unable to view R TM due to cerumen  Eyes: Conjunctivae are normal. Right eye exhibits no discharge. Left eye exhibits no discharge.  Neck: Normal range of motion. Neck supple. No thyromegaly present.  Cardiovascular: Normal rate, regular rhythm and normal heart sounds.   No murmur heard. bilat LE +2 pretibial edema. R>L. Pt states he is taking lasix.  Pulmonary/Chest: Effort normal. No respiratory distress. He has no wheezes. He has rales (bilateral, all lung fields).  Lymphadenopathy:    He has no cervical adenopathy.  Neurological: He is alert and  oriented to person, place, and time.  Skin: Skin is warm and dry.  Psychiatric: He has a normal mood and affect. His behavior is normal. Thought content normal.  Vitals reviewed.         Assessment & Plan:  1. CAP (community acquired pneumonia) DD: pulm edema - doxycycline (VIBRA-TABS) 100 MG tablet; Take 1 tablet (100 mg total) by mouth 2 (two) times daily.  Dispense: 14 tablet; Refill: 0 - albuterol (PROVENTIL HFA;VENTOLIN HFA) 108 (90 BASE) MCG/ACT inhaler; 2 puffs twice daily for 4-5 days, then q6h PRN cough  Dispense: 1 Inhaler; Refill: 0 F/u 2 weeks or sooner if no improvement.  2. LE edema Continue lasix

## 2014-04-30 ENCOUNTER — Encounter: Payer: Medicare HMO | Admitting: Neurology

## 2014-04-30 ENCOUNTER — Telehealth: Payer: Self-pay | Admitting: Internal Medicine

## 2014-04-30 DIAGNOSIS — Z029 Encounter for administrative examinations, unspecified: Secondary | ICD-10-CM

## 2014-04-30 NOTE — Telephone Encounter (Signed)
Caller name: Bethena Roys from [facility]  Call back number: [phone] ext [phone]   Reason for call:  [facility] requesting last 2 or 3 PSA please fax to [facility] (f) [phone]

## 2014-04-30 NOTE — Telephone Encounter (Signed)
PSA results from 12/12/2010, 12/19/2010, and 01/27/2013 faxed to [facility] as requested.

## 2014-05-01 ENCOUNTER — Ambulatory Visit (HOSPITAL_BASED_OUTPATIENT_CLINIC_OR_DEPARTMENT_OTHER)
Admission: RE | Admit: 2014-05-01 | Discharge: 2014-05-01 | Disposition: A | Discharge status: Home / Self Care | Payer: Medicare HMO | Source: Ambulatory Visit | Attending: Nurse Practitioner | Admitting: Nurse Practitioner

## 2014-05-01 ENCOUNTER — Encounter: Payer: Self-pay | Admitting: Nurse Practitioner

## 2014-05-01 ENCOUNTER — Telehealth: Payer: Self-pay | Admitting: Internal Medicine

## 2014-05-01 ENCOUNTER — Other Ambulatory Visit: Payer: Self-pay | Admitting: Nurse Practitioner

## 2014-05-01 ENCOUNTER — Ambulatory Visit (INDEPENDENT_AMBULATORY_CARE_PROVIDER_SITE_OTHER): Payer: Medicare HMO | Admitting: Nurse Practitioner

## 2014-05-01 VITALS — BP 120/80 | HR 70 | Temp 97.7°F | Ht 68.0 in | Wt 164.0 lb

## 2014-05-01 DIAGNOSIS — I10 Essential (primary) hypertension: Secondary | ICD-10-CM | POA: Diagnosis not present

## 2014-05-01 DIAGNOSIS — R05 Cough: Secondary | ICD-10-CM

## 2014-05-01 DIAGNOSIS — F17210 Nicotine dependence, cigarettes, uncomplicated: Secondary | ICD-10-CM | POA: Diagnosis not present

## 2014-05-01 DIAGNOSIS — R062 Wheezing: Secondary | ICD-10-CM

## 2014-05-01 DIAGNOSIS — R059 Cough, unspecified: Secondary | ICD-10-CM

## 2014-05-01 LAB — CBC WITH DIFFERENTIAL/PLATELET
BASOS PCT: 0 % (ref 0–1)
Basophils Absolute: 0 10*3/uL (ref 0.0–0.1)
Eosinophils Absolute: 0 10*3/uL (ref 0.0–0.7)
Eosinophils Relative: 1 % (ref 0–5)
HCT: 40 % (ref 39.0–52.0)
HEMOGLOBIN: 13.6 g/dL (ref 13.0–17.0)
Lymphocytes Relative: 27 % (ref 12–46)
Lymphs Abs: 0.8 10*3/uL (ref 0.7–4.0)
MCH: 27.8 pg (ref 26.0–34.0)
MCHC: 34 g/dL (ref 30.0–36.0)
MCV: 81.6 fL (ref 78.0–100.0)
MPV: 9.7 fL (ref 8.6–12.4)
Monocytes Absolute: 0.3 10*3/uL (ref 0.1–1.0)
Monocytes Relative: 11 % (ref 3–12)
NEUTROS PCT: 61 % (ref 43–77)
Neutro Abs: 1.9 10*3/uL (ref 1.7–7.7)
Platelets: 201 10*3/uL (ref 150–400)
RBC: 4.9 MIL/uL (ref 4.22–5.81)
RDW: 17.2 % — AB (ref 11.5–15.5)
WBC: 3.1 10*3/uL — ABNORMAL LOW (ref 4.0–10.5)

## 2014-05-01 LAB — COMPREHENSIVE METABOLIC PANEL
ALK PHOS: 85 U/L (ref 39–117)
ALT: 14 U/L (ref 0–53)
AST: 18 U/L (ref 0–37)
Albumin: 4.1 g/dL (ref 3.5–5.2)
BILIRUBIN TOTAL: 0.5 mg/dL (ref 0.2–1.2)
BUN: 24 mg/dL — ABNORMAL HIGH (ref 6–23)
CO2: 26 mEq/L (ref 19–32)
Calcium: 9 mg/dL (ref 8.4–10.5)
Chloride: 103 mEq/L (ref 96–112)
Creat: 2.06 mg/dL — ABNORMAL HIGH (ref 0.50–1.35)
GLUCOSE: 92 mg/dL (ref 70–99)
Potassium: 4.4 mEq/L (ref 3.5–5.3)
SODIUM: 140 meq/L (ref 135–145)
Total Protein: 6.7 g/dL (ref 6.0–8.3)

## 2014-05-01 MED ORDER — GUAIFENESIN ER 600 MG PO TB12: 600.0000 mg | ORAL_TABLET | Freq: Two times a day (BID) | ORAL | Status: DC

## 2014-05-01 MED ORDER — PREDNISONE 10 MG PO TABS: ORAL_TABLET | ORAL | Status: DC

## 2014-05-01 NOTE — Patient Instructions (Signed)
Please start prednisone & guaifenesen. Continue antibiotic twice daily until finished. Continue inhaler twice daily.  Please get labs & chest xray. My office will call with lab results.

## 2014-05-01 NOTE — Addendum Note (Signed)
Addended by: Julieta Bellini on: 05/01/2014 02:26 PM   Modules accepted: Orders

## 2014-05-01 NOTE — Telephone Encounter (Addendum)
FYI. Patient has appt today at 1:45.

## 2014-05-01 NOTE — Progress Notes (Signed)
Pre visit review using our clinic review tool, if applicable. No additional management support is needed unless otherwise documented below in the visit note. 

## 2014-05-01 NOTE — Telephone Encounter (Signed)
Patient Name: Zachary Fields  DOB: Jan 27, 1946    Initial Comment Caller states he was seen on Saturday- He was given an antibiotic and inhaler. Cough.   Nurse Assessment  Nurse: Mallie Mussel, RN, Alveta Heimlich Date/Time Eilene Ghazi Time): 05/01/2014 12:02:38 PM  Confirm and document reason for call. If symptomatic, describe symptoms. ---Caller states that he was seen Saturday and was prescribed an antibiotic and an inhaler. His cough is not improving. He denies difficulty breathing. He still feels sick and has the cough. He has been running low grade fevers on and off. He denies congestion. His cough is a non productive cough.  Has the patient traveled out of the country within the last 30 days? ---No  Does the patient require triage? ---Yes  Related visit to physician within the last 2 weeks? ---Yes  Does the PT have any chronic conditions? (i.e. diabetes, asthma, etc.) ---Yes  List chronic conditions. ---HTN, Stage III Kidney disease, Brain Tumor.     Guidelines    Guideline Title Affirmed Question Affirmed Notes  Cough - Acute Non-Productive [1] Fever returns after gone for over 24 hours AND [2] symptoms worse or not improved    Final Disposition User   See Physician within [place], Therapist, sports, Alveta Heimlich

## 2014-05-01 NOTE — Progress Notes (Signed)
   Subjective:    Patient ID: Zachary Fields, male    DOB: August 18, 1945, 69 y.o.   MRN: [id number]  Cough This is a new problem. The current episode started in the past 7 days. The problem has been unchanged. The problem occurs every few hours. The cough is non-productive. Associated symptoms include wheezing. Pertinent negatives include no chest pain, chills, ear congestion, ear pain, fever, headaches, nasal congestion, sore throat or shortness of breath. Associated symptoms comments: fatigue. Risk factors for lung disease include smoking/tobacco exposure. Treatments tried: saw pt in ofc 3 da-started doxy & alb inhaler-pt felt better for 1 day, thenhas felt tired & concerned that cough is not improved. His past medical history is significant for COPD.      Review of Systems  Constitutional: Positive for fatigue. Negative for fever and chills.       Pt lives with daughter  HENT: Negative for ear pain and sore throat.   Respiratory: Positive for cough and wheezing. Negative for shortness of breath.   Cardiovascular: Negative for chest pain.  Neurological: Negative for headaches.       Objective:   Physical Exam  Constitutional: He is oriented to person, place, and time. He appears well-developed and well-nourished. No distress.  Lost 4 lb in 4 days-pt says he is eating less, but is eating & drinking daily. LE are less swollen today-perhaps he has had periph fluid loss.  HENT:  Head: Normocephalic and atraumatic.  Eyes: Conjunctivae are normal. Right eye exhibits no discharge. Left eye exhibits no discharge.  Cardiovascular: Normal rate, regular rhythm and normal heart sounds.   No murmur heard. +1 pretibial edema, bilat. Improved since last week   Pulmonary/Chest: Effort normal. No respiratory distress. He has wheezes (scattered). He has no rales. He exhibits no tenderness.  Neurological: He is alert and oriented to person, place, and time.  Skin: Skin is warm and dry.  Psychiatric: He  has a normal mood and affect. His behavior is normal. Thought content normal.  Vitals reviewed.         Assessment & Plan:  1. Cough Started doxy & alb inh 4da Persistent fatigue, afebrile Smoker LE edema improved - DG Chest 2 View; Future - CBC with Differential/Platelet; Future - Comprehensive metabolic panel; Future - Brain natriuretic peptide; Future - guaiFENesin (MUCINEX) 600 MG 12 hr tablet; Take 1 tablet (600 mg total) by mouth 2 (two) times daily.  Dispense: 20 tablet; Refill: 0 - predniSONE (DELTASONE) 10 MG tablet; Take 4Tpo qam X 2d, then 3T po qam X 2d, then 2T po qd X 2d, then 1T po qam X 2d.  Dispense: 20 tablet; Refill: 0  2. Wheezing - DG Chest 2 View; Future - guaiFENesin (MUCINEX) 600 MG 12 hr tablet; Take 1 tablet (600 mg total) by mouth 2 (two) times daily.  Dispense: 20 tablet; Refill: 0 - predniSONE (DELTASONE) 10 MG tablet; Take 4Tpo qam X 2d, then 3T po qam X 2d, then 2T po qd X 2d, then 1T po qam X 2d.  Dispense: 20 tablet; Refill: 0  F/u 2 weeks

## 2014-05-02 LAB — BRAIN NATRIURETIC PEPTIDE: Brain Natriuretic Peptide: 42.4 pg/mL (ref 0.0–100.0)

## 2014-05-02 NOTE — Telephone Encounter (Signed)
Patient notified of results. Patient will call [facility] office to schedule appt.

## 2014-05-02 NOTE — Telephone Encounter (Signed)
LMOVM for pt to return call 

## 2014-05-02 NOTE — Telephone Encounter (Signed)
pls call pt: Advise CXR does not show pneumonia, so no change to treatment. Blood work shows white cells are slightly low, this is change & should be followed w/ OV w/ Dr Larose Kells in 3-4 weeks. Pls schedule OV for neutropenia.

## 2014-05-04 ENCOUNTER — Ambulatory Visit (INDEPENDENT_AMBULATORY_CARE_PROVIDER_SITE_OTHER): Payer: Medicare HMO | Admitting: Internal Medicine

## 2014-05-04 ENCOUNTER — Encounter: Payer: Self-pay | Admitting: Internal Medicine

## 2014-05-04 VITALS — BP 142/74 | HR 65 | Temp 98.0°F | Ht 68.0 in | Wt 166.0 lb

## 2014-05-04 DIAGNOSIS — J411 Mucopurulent chronic bronchitis: Secondary | ICD-10-CM

## 2014-05-04 NOTE — Patient Instructions (Signed)
Finish the  prednisone as prescribed  Continue taking Mucinex DM as needed for cough  If you don't continue improving or if you get  worse please call the office  You are overdue for a general checkup, please schedule a physical exam within the next 2 or 3 months

## 2014-05-04 NOTE — Progress Notes (Signed)
Pre visit review using our clinic review tool, if applicable. No additional management support is needed unless otherwise documented below in the visit note. 

## 2014-05-04 NOTE — Progress Notes (Signed)
Subjective:    Patient ID: Zachary Fields, male    DOB: [birth year], 69 y.o.   MRN: [id number]  DOS:  05/04/2014 Type of visit - description : f/u Interval history: Was seen 04/27/2014 with cough, rales on exam, diagnosed with community-acquired pneumonia and prescribed doxycycline for 7 days, he initially improved but then he felt poorly again. Seen again to 2/23/ 2016: Continued with cough and wheezing, was prescribed prednisone. Also the ordered further eval: chest x-ray negative, WBC 3.1, creatinine 2.0 at baseline, BNP negative. Here for follow-up   Review of Systems In general feeling better, cough has decrease, wheezing has decreased. No sputum production to speak of. Denies fever chills No chest pain, shortness of breath is about baseline. Lower extremity edema on and off at baseline No nausea, vomiting, diarrhea. Good compliance with antibiotics, he is about to finish. Taking prednisone.  Past Medical History  Diagnosis Date  . Hypertension   . Anemia   . 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 CRI (chronic renal insufficiency)   . Depression   . Hyperhomocysteinemia   . 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 Vertell Limber in 1997. Shunt replaced 06-2008  . Dizziness     Chronic paresthesias and dizziness not related with stroke  . Renal cyst     08-2009 Renal cysts( R ) at outside hospital (seen also in a u/s 2007), [facility] for f/u u/s 11-11  . Deaf     L ear, poor hearing R   . 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 Diverticulosis   . Colon polyps   . 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 BPH (benign prostatic hyperplasia) 12/12/2010    Past Surgical History  Procedure Laterality Date  . Laparotomy      s/p removal of swallowed of a FB from the stomach when he was 69 y/o    . Brain surgery  several    s/p a VP shunt, revised  by Dr. Vertell Limber in 1997.  Shunt replaced 06-2008.    History   Social History  . Marital Status: Divorced    Spouse Name: N/A  . Number of Children: 2  . Years of Education: N/A   Occupational History  . accountant    Social History Main Topics  . Smoking status: Current Every Day Smoker -- 1.50 packs/day    Types: Cigarettes  . Smokeless tobacco: Never Used  . Alcohol Use: No  . Drug Use: No  . Sexual Activity: Not on file   Other Topics Concern  . Not on file   Social History Narrative   Works in [place], 2 daughters ([name]), lives w/  [name]         Medication List       This list is accurate as of: 05/04/14 10:47 AM.  Always use your most recent med list.               albuterol 108 (90 BASE) MCG/ACT inhaler  Commonly known as:  PROVENTIL HFA;VENTOLIN HFA  2 puffs twice daily for 4-5 days, then q6h PRN cough     aspirin 325 MG tablet  Take 325 mg by mouth daily.     cyanocobalamin 1000 MCG/ML injection  Commonly known as:  (VITAMIN B-12)  Inject 1 mL (1,000 mcg total) into the muscle every 30 (thirty) days. Diagnosis 280.1     doxycycline 100 MG tablet  Commonly known as:  VIBRA-TABS  Take 1 tablet (100 mg total) by mouth 2 (two) times daily.     felodipine 10 MG 24 hr tablet  Commonly known as:  PLENDIL  Take 1 tablet (10 mg total) by mouth daily.     FOLIC ACID PO  Take 1 tablet by mouth daily.     furosemide 40 MG tablet  Commonly known as:  LASIX  TAKE ONE TABLET BY MOUTH ONCE DAILY     guaiFENesin 600 MG 12 hr tablet  Commonly known as:  MUCINEX  Take 1 tablet (600 mg total) by mouth 2 (two) times daily.     predniSONE 10 MG tablet  Commonly known as:  DELTASONE  Take 4Tpo qam X 2d, then 3T po qam X 2d, then 2T po qd X 2d, then 1T po qam X 2d.     tamsulosin 0.4 MG Caps capsule  Commonly known as:  FLOMAX  TAKE ONE CAPSULE BY MOUTH EVERY DAY AFTER SUPPER           Objective:   Physical Exam BP 142/74 mmHg  Pulse 65  Temp(Src) 98 F (36.7  C) (Oral)  Ht 5\' 8"  (1.727 m)  Wt 166 lb (75.297 kg)  BMI 25.25 kg/m2  SpO2 94% General:   Well developed, well nourished . NAD.  Lungs:  Rhonchi bilaterally, scattered wheezing. Minimal cough noted during the exam today. Normal respiratory effort, no intercostal retractions, no accessory muscle use. Heart: RRR,  no murmur.   LE: Trace periankle edema bilaterally  Skin: Not pale. Not jaundice Neurologic:  alert & oriented X3.  Psych--  Cognition and judgment appear intact.  Cooperative with normal attention span and concentration.  Behavior appropriate. No anxious or depressed appearing.       Assessment & Plan:

## 2014-05-04 NOTE — Assessment & Plan Note (Signed)
  COPD w/  exacerbation, Status post doxycycline, improving,  on prednisone. Chest x-ray showed no pneumonia, WBC was slightly low, probably due to a viral infection. Plan:  Continue prednisone. Unfortunately he continue smoking and reports he is not ready to quit. Also, I encouraged him to come back for a physical

## 2014-05-07 ENCOUNTER — Telehealth: Payer: Self-pay | Admitting: Neurology

## 2014-05-07 NOTE — Telephone Encounter (Signed)
Pt no showed 04/30/14 EMG appt. No show letter + policy mailed to pt / Sherri S.     Sherri - charge entry, no show letter+no show policy

## 2014-05-11 ENCOUNTER — Encounter: Payer: Self-pay | Admitting: Nurse Practitioner

## 2014-05-11 ENCOUNTER — Ambulatory Visit (INDEPENDENT_AMBULATORY_CARE_PROVIDER_SITE_OTHER): Payer: Medicare HMO | Admitting: Nurse Practitioner

## 2014-05-11 VITALS — BP 134/78 | HR 68 | Temp 98.0°F | Ht 68.0 in | Wt 166.0 lb

## 2014-05-11 DIAGNOSIS — J449 Chronic obstructive pulmonary disease, unspecified: Secondary | ICD-10-CM

## 2014-05-11 DIAGNOSIS — R059 Cough, unspecified: Secondary | ICD-10-CM

## 2014-05-11 DIAGNOSIS — R05 Cough: Secondary | ICD-10-CM

## 2014-05-11 MED ORDER — BENZONATATE 200 MG PO CAPS: 200.0000 mg | ORAL_CAPSULE | Freq: Three times a day (TID) | ORAL | Status: DC | PRN

## 2014-05-11 MED ORDER — FLUTICASONE-SALMETEROL 100-50 MCG/DOSE IN AEPB: 1.0000 | INHALATION_SPRAY | Freq: Two times a day (BID) | RESPIRATORY_TRACT | Status: DC

## 2014-05-11 NOTE — Patient Instructions (Addendum)
Start advair. Use twice daily. Do not use albuterol inhaler unless you feel short of breath or cannot stop coughing.  Start daily sinus rinses-Neilmed sinus rinse. Use for at least 10 days, but daily use will improve sinus health.  Take benzonatate capsules up to 3 times daily for cough.  Please follow up in 1 month to evaluate new inhaler & cough.  Consider cutting back on cigarettes.  Have a great trip!

## 2014-05-11 NOTE — Progress Notes (Signed)
Pre visit review using our clinic review tool, if applicable. No additional management support is needed unless otherwise documented below in the visit note. 

## 2014-05-11 NOTE — Progress Notes (Signed)
Subjective:     Zachary Fields is a 69 y.o. male who presents for evaluation of nonproductive cough. Symptoms began 2 weeks ago. Symptoms have been gradually improving since that time, but he has persistent dry cough throughout day & wakes with cough at night. He has completed course of doxycycline & prednisone. He is using albuterol twice daily as directed & feels cough is more productive after her uses it. Past history is significant for COPD and smoker. He is not insterested in quitting or cutting back cigarettes.  He is planning cruise at end of month. He refuses flu vaccine.  The following portions of the patient's history were reviewed and updated as appropriate: allergies, current medications, past medical history, past social history, past surgical history and problem list.  Review of Systems Constitutional: negative for fatigue and fevers Ears, nose, mouth, throat, and face: positive for nasal congestion, negative for sore throat Respiratory: negative for wheezing Cardiovascular: negative for irregular heart beat    Objective:    BP 134/78 mmHg  Pulse 68  Temp(Src) 98 F (36.7 C) (Temporal)  Ht 5\' 8"  (1.727 m)  Wt 166 lb (75.297 kg)  BMI 25.25 kg/m2  SpO2 96% General appearance: alert, cooperative, appears stated age and no distress Head: Normocephalic, without obvious abnormality, atraumatic Eyes: negative findings: lids and lashes normal, conjunctivae and sclerae normal and sqiunts w/ R lid. Sometimes alternates to squint on L side. Lungs: wheezes scattered wheeze Heart: regular rate and rhythm, S1, S2 normal, no murmur, click, rub or gallop Extremities: edema +1 pitting pretibial edema Neurologic: Gait: walks with cane    Assessment:Plan    Cough   Possibly post-viral pneumonitis, COPD. Start advair Albuterol PRN Start daily sinus rinse Benzonatate capsule. F/u 1 mo.

## 2014-05-14 ENCOUNTER — Encounter: Payer: Self-pay | Admitting: *Deleted

## 2014-05-15 ENCOUNTER — Ambulatory Visit: Payer: Medicare HMO | Admitting: Nurse Practitioner

## 2014-05-18 ENCOUNTER — Encounter: Payer: Self-pay | Admitting: Internal Medicine

## 2014-05-18 ENCOUNTER — Ambulatory Visit (INDEPENDENT_AMBULATORY_CARE_PROVIDER_SITE_OTHER): Payer: Medicare HMO | Admitting: *Deleted

## 2014-05-18 DIAGNOSIS — E538 Deficiency of other specified B group vitamins: Secondary | ICD-10-CM

## 2014-05-18 MED ORDER — CYANOCOBALAMIN 1000 MCG/ML IJ SOLN
1000.0000 ug | Freq: Once | INTRAMUSCULAR | Status: AC
  Administered 2014-05-18: 1000 ug via INTRAMUSCULAR

## 2014-05-18 NOTE — Progress Notes (Signed)
Pre visit review using our clinic review tool, if applicable. No additional management support is needed unless otherwise documented below in the visit note.  Patient tolerated injection well.  Next injection scheduled for 06/22/14.

## 2014-06-08 DIAGNOSIS — N401 Enlarged prostate with lower urinary tract symptoms: Secondary | ICD-10-CM

## 2014-06-08 HISTORY — DX: Benign prostatic hyperplasia with lower urinary tract symptoms: N40.1

## 2014-06-15 ENCOUNTER — Ambulatory Visit: Payer: Medicare HMO | Admitting: Nurse Practitioner

## 2014-06-22 ENCOUNTER — Ambulatory Visit (INDEPENDENT_AMBULATORY_CARE_PROVIDER_SITE_OTHER): Payer: Medicare HMO | Admitting: *Deleted

## 2014-06-22 DIAGNOSIS — E538 Deficiency of other specified B group vitamins: Secondary | ICD-10-CM

## 2014-06-22 MED ORDER — CYANOCOBALAMIN 1000 MCG/ML IJ SOLN
1000.0000 ug | Freq: Once | INTRAMUSCULAR | Status: AC
  Administered 2014-06-22: 1000 ug via INTRAMUSCULAR

## 2014-06-22 NOTE — Progress Notes (Signed)
Pre visit review using our clinic review tool, if applicable. No additional management support is needed unless otherwise documented below in the visit note.  Patient tolerated injection well.  Next appointment scheduled for 07/20/14.

## 2014-06-29 ENCOUNTER — Encounter: Payer: Self-pay | Admitting: Nurse Practitioner

## 2014-06-29 ENCOUNTER — Ambulatory Visit (INDEPENDENT_AMBULATORY_CARE_PROVIDER_SITE_OTHER): Payer: Medicare HMO | Admitting: Nurse Practitioner

## 2014-06-29 VITALS — BP 145/81 | HR 69 | Temp 98.3°F | Ht 68.0 in | Wt 171.0 lb

## 2014-06-29 DIAGNOSIS — R059 Cough, unspecified: Secondary | ICD-10-CM

## 2014-06-29 DIAGNOSIS — R05 Cough: Secondary | ICD-10-CM

## 2014-06-29 NOTE — Progress Notes (Signed)
Subjective:    Zachary Fields is a 69 y.o. male presents for follow up for COPD exacerbATION. Zachary Fields is 2ppd smoker with Hx COPD & stroke w/R sided weakness.  He developed cough w/increased mucous production & SOB 2 mos ago. He was treated with with doxy, prednisone, guaifenesen and albuterol inhaler. He had minimal improvement although CXR was clear. Symptoms improved when started advair.  Today he reports he has slight cough, but feels much better, went on cruise without coughing. He stopped using advair few weeks ago, no exacerbation of symptoms. He continues to smoke & is not interested in quitting.   The following portions of the patient's history were reviewed and updated as appropriate: allergies, current medications, past medical history, past social history, past surgical history and problem list.  Review of Systems Pertinent items are noted in HPI.     Objective:    BP 145/81 mmHg  Pulse 69  Temp(Src) 98.3 F (36.8 C) (Oral)  Ht 5\' 8"  (1.727 m)  Wt 171 lb (77.565 kg)  BMI 26.01 kg/m2  SpO2 96%  BP 145/81 mmHg  Pulse 69  Temp(Src) 98.3 F (36.8 C) (Oral)  Ht 5\' 8"  (1.727 m)  Wt 171 lb (77.565 kg)  BMI 26.01 kg/m2  SpO2 96% General appearance: alert, cooperative, appears stated age and no distress Head: Normocephalic, without obvious abnormality, atraumatic Eyes: negative findings: lids and lashes normal and conjunctivae and sclerae normal, positive findings: squints L eye Lungs: coarse BS bilat, no wheezes Heart: regular rate and rhythm, S1, S2 normal, no murmur, click, rub or gallop Neurologic: Mental status: Alert, oriented, thought content appropriate Motor: squints w/L eye Gait: cane assisted     Assessment:Plan  1. Cough Improved, nearly resolved. Use inhaler s PRN F/u w/Zachary Fields for chronic conditions.

## 2014-06-29 NOTE — Progress Notes (Signed)
Pre visit review using our clinic review tool, if applicable. No additional management support is needed unless otherwise documented below in the visit note. 

## 2014-06-29 NOTE — Patient Instructions (Signed)
Continue to see Dr Larose Kells for chronic conditions.

## 2014-07-05 LAB — HEPATIC FUNCTION PANEL
ALT: 10 U/L (ref 10–40)
AST: 12 U/L — AB (ref 14–40)
Alkaline Phosphatase: 109 U/L (ref 25–125)
Bilirubin, Total: 0.5 mg/dL

## 2014-07-05 LAB — BASIC METABOLIC PANEL
BUN: 21 mg/dL (ref 4–21)
CREATININE: 2.1 mg/dL — AB (ref <=1.3)
GLUCOSE: 88 mg/dL
POTASSIUM: 4.2 mmol/L (ref 3.4–5.3)
Sodium: 138 mmol/L (ref 137–147)

## 2014-07-05 LAB — CBC AND DIFFERENTIAL
HEMATOCRIT: 37 % — AB (ref 41–53)
HEMOGLOBIN: 13.2 g/dL — AB (ref 13.5–17.5)
NEUTROS ABS: 6 /uL
Platelets: 258 10*3/uL (ref 150–399)
WBC: 7.8 10^3/mL

## 2014-07-07 ENCOUNTER — Other Ambulatory Visit: Payer: Self-pay | Admitting: Internal Medicine

## 2014-07-20 ENCOUNTER — Ambulatory Visit (INDEPENDENT_AMBULATORY_CARE_PROVIDER_SITE_OTHER): Payer: Medicare HMO | Admitting: *Deleted

## 2014-07-20 DIAGNOSIS — E538 Deficiency of other specified B group vitamins: Secondary | ICD-10-CM | POA: Diagnosis not present

## 2014-07-20 DIAGNOSIS — R7989 Other specified abnormal findings of blood chemistry: Secondary | ICD-10-CM

## 2014-07-20 MED ORDER — CYANOCOBALAMIN 1000 MCG/ML IJ SOLN
1000.0000 ug | Freq: Once | INTRAMUSCULAR | Status: AC
  Administered 2014-07-20: 1000 ug via INTRAMUSCULAR

## 2014-07-20 NOTE — Progress Notes (Signed)
Pre visit review using our clinic review tool, if applicable. No additional management support is needed unless otherwise documented below in the visit note.  Patient tolerated injection well.  Next injection scheduled 08/17/14. 

## 2014-07-25 ENCOUNTER — Encounter: Payer: Self-pay | Admitting: Internal Medicine

## 2014-08-02 ENCOUNTER — Ambulatory Visit (INDEPENDENT_AMBULATORY_CARE_PROVIDER_SITE_OTHER): Payer: Medicare HMO | Admitting: Neurology

## 2014-08-02 DIAGNOSIS — M5416 Radiculopathy, lumbar region: Secondary | ICD-10-CM | POA: Diagnosis not present

## 2014-08-02 DIAGNOSIS — M5441 Lumbago with sciatica, right side: Secondary | ICD-10-CM

## 2014-08-02 NOTE — Procedures (Signed)
[facility]  [address], [address]  [place], [place] [address] Tel: [phone] Fax:  [phone] Test Date:  08/02/2014  Patient: Zachary Fields DOB: 1945/05/18 Physician: Narda Amber  Sex: Male Height: 5\' 9"  Ref Phys: Alonza Bogus  ID#: [id number] Temp: 33.6C Technician: Laureen Ochs R. NCS T.   Patient Complaints: Patient is a 69 year old male here for evaluation of his legs due to pain radiating from his back down to his legs.  NCV & EMG Findings: Electrodiagnostic testing of the lower extremities was limited and prematurely terminated at patient's request due to intolerance of testing. Findings are as follows: 1. Bilateral superficial peroneal sensory responses are reduced. Bilateral sural sensory responses are within normal limits.  2. Bilateral peroneal motor responses are essentially absent.   Impression: This is an incomplete electrodiagnostic study of the lower extremities, as testing was terminated at patient's request.  No meaningful conclusions can be made from the limited data.   ___________________________ Narda Amber    Nerve Conduction Studies Anti Sensory Summary Table   Stim Site NR Peak (ms) Norm Peak (ms) P-T Amp (V) Norm P-T Amp  Left Sup Peroneal Anti Sensory (Ant Lat Mall)    unable to tolerate  12 cm    2.5 <4.6 2.7 >3  Right Sup Peroneal Anti Sensory (Ant Lat Mall)  35C  12 cm    3.3 <4.6 1.3 >3  Left Sural Anti Sensory (Lat Mall)  Calf    3.4 <4.6 3.9 >3  Right Sural Anti Sensory (Lat Mall)  35C  Calf    3.4 <4.6 4.5 >3   Motor Summary Table   Stim Site NR Onset (ms) Norm Onset (ms) O-P Amp (mV) Norm O-P Amp Site1 Site2 Delta-0 (ms) Dist (cm) Vel (m/s) Norm Vel (m/s)  Left Peroneal Motor (Ext Dig Brev)  Ankle    3.9 <6.0 0.9 >2.5 B Fib Ankle 8.1 34.0 42 >40  B Fib    12.0  0.6         Right Peroneal Motor (Ext Dig Brev)  35C  Ankle NR  <6.0  >2.5 B Fib Ankle  0.0  >40  B Fib NR                Waveforms:

## 2014-08-15 ENCOUNTER — Telehealth: Payer: Self-pay | Admitting: *Deleted

## 2014-08-15 NOTE — Telephone Encounter (Signed)
Okay to provide shots and check a B12 level when he comes back

## 2014-08-15 NOTE — Telephone Encounter (Signed)
Patient has been getting monthly B12 injections, but no recent B12 lab.  Should he have a repeat drawn?  Patient is coming to get injection 08/17/14- OK to give?

## 2014-08-17 ENCOUNTER — Ambulatory Visit (INDEPENDENT_AMBULATORY_CARE_PROVIDER_SITE_OTHER): Payer: Medicare HMO | Admitting: *Deleted

## 2014-08-17 DIAGNOSIS — E538 Deficiency of other specified B group vitamins: Secondary | ICD-10-CM | POA: Diagnosis not present

## 2014-08-17 MED ORDER — CYANOCOBALAMIN 1000 MCG/ML IJ SOLN
1000.0000 ug | Freq: Once | INTRAMUSCULAR | Status: AC
  Administered 2014-08-17: 1000 ug via INTRAMUSCULAR

## 2014-08-17 NOTE — Progress Notes (Signed)
Pre visit review using our clinic review tool, if applicable. No additional management support is needed unless otherwise documented below in the visit note.  Per phone note:    Colon Branch, MD at 08/15/2014 5:12 PM     Status: Signed       Expand All Collapse All   Okay to provide shots and check a B12 level when he comes back       Patient tolerated injection well.

## 2014-08-30 ENCOUNTER — Other Ambulatory Visit: Payer: Self-pay | Admitting: Internal Medicine

## 2014-08-30 ENCOUNTER — Other Ambulatory Visit: Payer: Self-pay

## 2014-08-30 MED ORDER — FUROSEMIDE 40 MG PO TABS: 40.0000 mg | ORAL_TABLET | Freq: Every day | ORAL | Status: DC

## 2014-09-03 ENCOUNTER — Other Ambulatory Visit: Payer: Self-pay

## 2014-09-21 ENCOUNTER — Ambulatory Visit (INDEPENDENT_AMBULATORY_CARE_PROVIDER_SITE_OTHER): Payer: Medicare HMO | Admitting: *Deleted

## 2014-09-21 ENCOUNTER — Other Ambulatory Visit (INDEPENDENT_AMBULATORY_CARE_PROVIDER_SITE_OTHER): Payer: Medicare HMO

## 2014-09-21 DIAGNOSIS — E538 Deficiency of other specified B group vitamins: Secondary | ICD-10-CM | POA: Diagnosis not present

## 2014-09-21 LAB — VITAMIN B12: Vitamin B-12: >1500 pg/mL — ABNORMAL HIGH (ref 211–911)

## 2014-09-21 MED ORDER — CYANOCOBALAMIN 1000 MCG/ML IJ SOLN
1000.0000 ug | Freq: Once | INTRAMUSCULAR | Status: AC
  Administered 2014-09-21: 1000 ug via INTRAMUSCULAR

## 2014-09-21 NOTE — Progress Notes (Signed)
Pre visit review using our clinic review tool, if applicable. No additional management support is needed unless otherwise documented below in the visit note.  Patient tolerated injection well. Patient escorted to lab for B12 levels.  Next injection scheduled 10/26/14.

## 2014-10-09 ENCOUNTER — Emergency Department (HOSPITAL_BASED_OUTPATIENT_CLINIC_OR_DEPARTMENT_OTHER)
Admission: EM | Admit: 2014-10-09 | Discharge: 2014-10-09 | Disposition: A | Discharge status: Home / Self Care | Payer: Medicare HMO | Attending: Emergency Medicine | Admitting: Emergency Medicine

## 2014-10-09 ENCOUNTER — Emergency Department (HOSPITAL_COMMUNITY): Payer: Medicare HMO

## 2014-10-09 ENCOUNTER — Encounter (HOSPITAL_BASED_OUTPATIENT_CLINIC_OR_DEPARTMENT_OTHER): Payer: Self-pay | Admitting: Emergency Medicine

## 2014-10-09 ENCOUNTER — Other Ambulatory Visit: Payer: Self-pay | Admitting: *Deleted

## 2014-10-09 DIAGNOSIS — R059 Cough, unspecified: Secondary | ICD-10-CM

## 2014-10-09 DIAGNOSIS — R42 Dizziness and giddiness: Secondary | ICD-10-CM | POA: Diagnosis not present

## 2014-10-09 DIAGNOSIS — Z8673 Personal history of transient ischemic attack (TIA), and cerebral infarction without residual deficits: Secondary | ICD-10-CM | POA: Insufficient documentation

## 2014-10-09 DIAGNOSIS — I10 Essential (primary) hypertension: Secondary | ICD-10-CM | POA: Insufficient documentation

## 2014-10-09 DIAGNOSIS — R05 Cough: Secondary | ICD-10-CM

## 2014-10-09 LAB — DIFFERENTIAL
BASOS ABS: 0 10*3/uL (ref 0.0–0.1)
Basophils Relative: 0 % (ref 0–1)
EOS ABS: 0.2 10*3/uL (ref 0.0–0.7)
Eosinophils Relative: 4 % (ref 0–5)
LYMPHS PCT: 26 % (ref 12–46)
Lymphs Abs: 1.7 10*3/uL (ref 0.7–4.0)
MONO ABS: 0.6 10*3/uL (ref 0.1–1.0)
Monocytes Relative: 9 % (ref 3–12)
NEUTROS PCT: 61 % (ref 43–77)
Neutro Abs: 4 10*3/uL (ref 1.7–7.7)

## 2014-10-09 LAB — COMPREHENSIVE METABOLIC PANEL
ALK PHOS: 77 U/L (ref 38–126)
ALT: 11 U/L — AB (ref 17–63)
ANION GAP: 11 (ref 5–15)
AST: 17 U/L (ref 15–41)
Albumin: 3.7 g/dL (ref 3.5–5.0)
BILIRUBIN TOTAL: 0.8 mg/dL (ref 0.3–1.2)
BUN: 23 mg/dL — AB (ref 6–20)
CHLORIDE: 107 mmol/L (ref 101–111)
CO2: 23 mmol/L (ref 22–32)
Calcium: 9.1 mg/dL (ref 8.9–10.3)
Creatinine, Ser: 2.36 mg/dL — ABNORMAL HIGH (ref 0.61–1.24)
GFR calc Af Amer: 31 mL/min — ABNORMAL LOW (ref >60)
GFR, EST NON AFRICAN AMERICAN: 27 mL/min — AB (ref >60)
Glucose, Bld: 88 mg/dL (ref 65–99)
Potassium: 4 mmol/L (ref 3.5–5.1)
Sodium: 141 mmol/L (ref 135–145)
Total Protein: 6.2 g/dL — ABNORMAL LOW (ref 6.5–8.1)

## 2014-10-09 LAB — ETHANOL: Alcohol, Ethyl (B): <5 mg/dL (ref <5)

## 2014-10-09 LAB — RAPID URINE DRUG SCREEN, HOSP PERFORMED
Amphetamines: NOT DETECTED
BARBITURATES: NOT DETECTED
BENZODIAZEPINES: NOT DETECTED
Cocaine: NOT DETECTED
Opiates: NOT DETECTED
Tetrahydrocannabinol: NOT DETECTED

## 2014-10-09 LAB — CBC
HEMATOCRIT: 42.7 % (ref 39.0–52.0)
Hemoglobin: 14.1 g/dL (ref 13.0–17.0)
MCH: 28 pg (ref 26.0–34.0)
MCHC: 33 g/dL (ref 30.0–36.0)
MCV: 84.9 fL (ref 78.0–100.0)
PLATELETS: 235 10*3/uL (ref 150–400)
RBC: 5.03 MIL/uL (ref 4.22–5.81)
RDW: 16.1 % — ABNORMAL HIGH (ref 11.5–15.5)
WBC: 6.5 10*3/uL (ref 4.0–10.5)

## 2014-10-09 LAB — URINALYSIS, ROUTINE W REFLEX MICROSCOPIC
Bilirubin Urine: NEGATIVE
Glucose, UA: NEGATIVE mg/dL
HGB URINE DIPSTICK: NEGATIVE
KETONES UR: NEGATIVE mg/dL
LEUKOCYTES UA: NEGATIVE
Nitrite: NEGATIVE
Protein, ur: NEGATIVE mg/dL
Specific Gravity, Urine: 1.013 (ref 1.005–1.030)
Urobilinogen, UA: 0.2 mg/dL (ref 0.0–1.0)
pH: 6.5 (ref 5.0–8.0)

## 2014-10-09 LAB — I-STAT CHEM 8, ED
BUN: 25 mg/dL — ABNORMAL HIGH (ref 6–20)
CALCIUM ION: 1.18 mmol/L (ref 1.13–1.30)
CHLORIDE: 106 mmol/L (ref 101–111)
CREATININE: 2.3 mg/dL — AB (ref 0.61–1.24)
Glucose, Bld: 90 mg/dL (ref 65–99)
HCT: 45 % (ref 39.0–52.0)
HEMOGLOBIN: 15.3 g/dL (ref 13.0–17.0)
Potassium: 4 mmol/L (ref 3.5–5.1)
SODIUM: 143 mmol/L (ref 135–145)
TCO2: 24 mmol/L (ref 0–100)

## 2014-10-09 LAB — CBG MONITORING, ED: GLUCOSE-CAPILLARY: 94 mg/dL (ref 65–99)

## 2014-10-09 LAB — PROTIME-INR
INR: 0.97 (ref 0.00–1.49)
Prothrombin Time: 13.1 seconds (ref 11.6–15.2)

## 2014-10-09 LAB — TROPONIN I: Troponin I: <0.03 ng/mL (ref <0.031)

## 2014-10-09 LAB — APTT: aPTT: 27 seconds (ref 24–37)

## 2014-10-09 MED ORDER — MECLIZINE HCL 25 MG PO TABS: 25.0000 mg | ORAL_TABLET | Freq: Once | ORAL | Status: DC

## 2014-10-09 MED ORDER — MECLIZINE HCL 25 MG PO TABS: 25.0000 mg | ORAL_TABLET | Freq: Three times a day (TID) | ORAL | Status: DC | PRN

## 2014-10-09 MED ORDER — FLUTICASONE-SALMETEROL 100-50 MCG/DOSE IN AEPB: 1.0000 | INHALATION_SPRAY | Freq: Two times a day (BID) | RESPIRATORY_TRACT | Status: DC

## 2014-10-09 NOTE — ED Notes (Signed)
Dizziness onset 0330

## 2014-10-09 NOTE — ED Provider Notes (Addendum)
CSN: [id number]     Arrival date & time 10/09/14  0814 History   First MD Initiated Contact with Patient 10/09/14 [phone]     Chief Complaint  Patient presents with  . Dizziness     (Consider location/radiation/quality/duration/timing/severity/associated sxs/prior Treatment) HPI Patient developed dizziness meaning sensation of road moving in front of him while driving yesterday 0:34 PM. Associated symptoms include vague visual changes which were transient. No headache. Nothing makes symptoms better or worse. He treated himself with meclizine, without relief. Presently he feels "numb all over my entire body" he also complains of generalized weakness since yesterday. No other associated symptoms. Nothing makes symptoms better or worse. Past Medical History  Diagnosis Date  . Hypertension   . Anemia   . 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 CRI (chronic renal insufficiency)   . Depression   . Hyperhomocysteinemia   . 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 Vertell Limber in 1997. Shunt replaced 06-2008  . Dizziness     Chronic paresthesias and dizziness not related with stroke  . Renal cyst     08-2009 Renal cysts( R ) at outside hospital (seen also in a u/s 2007), [facility] for f/u u/s 11-11  . Deaf     L ear, poor hearing R   . 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 Diverticulosis   . Colon polyps   . 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 BPH (benign prostatic hyperplasia) 12/12/2010  . Enlarged prostate with lower urinary tract symptoms (LUTS) 06/2014    Dr. Karsten Ro  . Microscopic hematuria   . Hyperhomocysteinemia   . 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 Scr returned to baseline of 1.9 on 02/17/2013, Dr. Quincy Simmonds   Past Surgical History  Procedure Laterality Date  . Laparotomy      s/p  removal of swallowed of a FB from the stomach when he was 69 y/o    . Brain surgery  several    s/p a VP shunt, revised  by Dr. Vertell Limber in 1997. Shunt replaced 06-2008.   Family History  Problem Relation Age of Onset  . Colon cancer Mother 42    colon  . Cancer Brother     bladder  . Prostate cancer Neg Hx   . Diabetes Neg Hx   . Stroke Neg Hx   . Coronary artery disease      Uncle MI at age 20s   History  Substance Use Topics  . Smoking status: Current Every Day Smoker -- 1.50 packs/day    Types: Cigarettes  . Smokeless tobacco: Never Used     Comment: ~ 1.5 ppd   . Alcohol Use: No   no illicit drug use  Review of Systems  HENT: Positive for hearing loss.        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  Respiratory: Negative.   Cardiovascular: Negative.   Gastrointestinal: Negative.   Musculoskeletal: Positive for gait problem.       Walks with cane chronically  Skin: Negative.   Neurological: Positive for dizziness, weakness and numbness.  Psychiatric/Behavioral: Negative.   All other systems reviewed and are negative.  Allergies  Varenicline tartrate  Home Medications   Prior to Admission medications   Medication Sig Start Date End Date Taking? Authorizing Provider  albuterol (PROVENTIL HFA;VENTOLIN HFA) 108 (90 BASE) MCG/ACT inhaler 2 puffs twice daily for 4-5 days, then q6h PRN cough 04/27/14   Irene Pap, NP  aspirin 325 MG tablet Take 325 mg by mouth daily.      Historical Provider, MD  benzonatate (TESSALON) 200 MG capsule Take 1 capsule (200 mg total) by mouth 3 (three) times daily as needed for cough. Patient not taking: Reported on 06/29/2014 05/11/14   Irene Pap, NP  cyanocobalamin (,VITAMIN B-12,) 1000 MCG/ML injection Inject 1 mL (1,000 mcg total) into the muscle every 30 (thirty) days. Diagnosis 280.1 10/21/12   Colon Branch, MD  felodipine (PLENDIL) 10 MG 24 hr tablet Take 1 tablet (10 mg total) by mouth daily. 08/30/14    Colon Branch, MD  Fluticasone-Salmeterol (ADVAIR) 100-50 MCG/DOSE AEPB Inhale 1 puff into the lungs 2 (two) times daily. Patient not taking: Reported on 06/29/2014 05/11/14   Irene Pap, NP  FOLIC ACID PO Take 1 tablet by mouth daily.    Historical Provider, MD  furosemide (LASIX) 40 MG tablet Take 1 tablet (40 mg total) by mouth daily. 08/30/14   Colon Branch, MD  tamsulosin (FLOMAX) 0.4 MG CAPS capsule Take 1 capsule (0.4 mg total) by mouth daily after supper. 07/09/14   Colon Branch, MD   There were no vitals taken for this visit. Physical Exam  Constitutional: He is oriented to person, place, and time. He appears well-developed and well-nourished. No distress.  Alert Glasgow Coma Score 15  HENT:  Head: Normocephalic and atraumatic.  Eyes: Conjunctivae are normal. Pupils are equal, round, and reactive to light.  Neck: Neck supple. No tracheal deviation present. No thyromegaly present.  No bruit  Cardiovascular: Regular rhythm.   No murmur heard. Bradycardic  Pulmonary/Chest: Effort normal and breath sounds normal.  Abdominal: Soft. Bowel sounds are normal. He exhibits no distension. There is no tenderness.  Musculoskeletal: Normal range of motion. He exhibits no edema or tenderness.  Neurological: He is alert and oriented to person, place, and time. No cranial nerve deficit. Coordination normal.  Gait is shuffling.(Baseline for patient per his daughter who accompanies him) finger to nose normal pronator drift normal. DTR symmetric bilaterally at knee jerk ankle jerk and biceps toes downward going bilaterally  Skin: Skin is warm and dry. No rash noted.  Psychiatric: He has a normal mood and affect.  Nursing note and vitals reviewed.   ED Course  Procedures (including critical care time) Labs Review Labs Reviewed - No data to display  Imaging Review No results found.   EKG Interpretation   Date/Time:  Tuesday October 09 2014 08:36:03 EDT Ventricular Rate:  53 PR Interval:   134 QRS Duration: 88 QT Interval:  422 QTC Calculation: 395 R Axis:   38 Text Interpretation:  Sinus bradycardia Otherwise normal ECG No  significant change since last tracing Confirmed by Winfred Leeds  MD, Glover Capano  [phone]) on 10/09/2014 9:05:27 AM     Results for orders placed or performed during the hospital encounter of 10/09/14  CBC  Result Value Ref Range   WBC 6.5 4.0 - 10.5 K/uL   RBC 5.03 4.22 - 5.81 MIL/uL   Hemoglobin 14.1 13.0 - 17.0 g/dL   HCT 42.7 39.0 - 52.0 %   MCV 84.9 78.0 - 100.0 fL   MCH 28.0 26.0 -  34.0 pg   MCHC 33.0 30.0 - 36.0 g/dL   RDW 16.1 (H) 11.5 - 15.5 %   Platelets 235 150 - 400 K/uL  Differential  Result Value Ref Range   Neutrophils Relative % 61 43 - 77 %   Neutro Abs 4.0 1.7 - 7.7 K/uL   Lymphocytes Relative 26 12 - 46 %   Lymphs Abs 1.7 0.7 - 4.0 K/uL   Monocytes Relative 9 3 - 12 %   Monocytes Absolute 0.6 0.1 - 1.0 K/uL   Eosinophils Relative 4 0 - 5 %   Eosinophils Absolute 0.2 0.0 - 0.7 K/uL   Basophils Relative 0 0 - 1 %   Basophils Absolute 0.0 0.0 - 0.1 K/uL   No results found.  cbg =94 (normal) MDM  Of note this is not a code stroke., As patient is past 8 hour window. Code stroke was called inadvertently Concern for posterior circulation stroke given transient visual changes, history of multiple strokes. Patient requires MRI scan. He will be transferred [facility] via private vehicle, driven by his daughter. They are instructed to go to [place] emergency Department immediately upon leaving here  Accepting physician Sherman, Lab work to be checked by EDP at [facility]. After getting MRI scan today his VP shunt will need to be reset by neurosurgeon. Suggest neurology consult Diagnosis vertigo Final diagnoses:  None        Orlie Dakin, MD 10/09/14 3016  Orlie Dakin, MD 10/09/14 0109  Orlie Dakin, MD 10/09/14 [phone]

## 2014-10-09 NOTE — ED Provider Notes (Addendum)
  Physical Exam  BP 117/73 mmHg  Pulse 50  Temp(Src) 97.7 F (36.5 C) (Oral)  Resp 16  Ht 5\' 6"  (1.676 m)  Wt 160 lb (72.576 kg)  BMI 25.84 kg/m2  SpO2 98%  Physical Exam  ED Course  Procedures  MDM Patient transferred from [facility]. Patient had dizziness since yesterday. Has VP shunts for inoperable pineal tumor. CT head here unremarkable. Initial exam showed horizontal nystagmus mostly to the left. Dec strength R side (chronic). Patient took meclizine before coming to the ED. Called Dr. Janann Colonel, who recommend MRI and if neg will not need stroke workup. MRI showed slightly enlarged pineal cyst. Repeat exam showed improved nystagmus and has no symptoms now. Walks with cane at baseline and ambulated with no problems. Called Dr. Vertell Limber, his neurosurgeon, who will see him in the office right away to reprogram the VP shunt. Will dc home with meclizine for vertigo.   Wandra Arthurs, MD 10/09/14 1516  Wandra Arthurs, MD 10/09/14 [phone]

## 2014-10-09 NOTE — ED Notes (Addendum)
Patient placed on cardiac monitor.

## 2014-10-09 NOTE — ED Notes (Signed)
Pt reutnred from MRI

## 2014-10-09 NOTE — Discharge Instructions (Signed)
Go to Dr. Melven Sartorius office right away to reprogram shunt.   Take meclizine as needed for dizziness.   Follow up with your doctor.   Return to ER if you have worse dizziness, vomiting, headaches.

## 2014-10-09 NOTE — ED Notes (Signed)
Patient placed on cardiac monitor.

## 2014-10-09 NOTE — ED Notes (Signed)
Cancelled code stroke per Dr. Darl Householder.   Linus Orn, [place] cancelled.  [place], Agricultural consultant notified.

## 2014-10-09 NOTE — Telephone Encounter (Signed)
RF request for advair LOV: 06/29/14 Next ov: 10/26/14 Last written: 05/11/14 #1 w 3RF

## 2014-10-10 ENCOUNTER — Other Ambulatory Visit: Payer: Self-pay | Admitting: *Deleted

## 2014-10-10 DIAGNOSIS — R05 Cough: Secondary | ICD-10-CM

## 2014-10-10 DIAGNOSIS — R059 Cough, unspecified: Secondary | ICD-10-CM

## 2014-10-10 MED ORDER — FLUTICASONE-SALMETEROL 100-50 MCG/DOSE IN AEPB: 1.0000 | INHALATION_SPRAY | Freq: Two times a day (BID) | RESPIRATORY_TRACT | Status: DC

## 2014-10-11 ENCOUNTER — Telehealth: Payer: Self-pay | Admitting: *Deleted

## 2014-10-11 NOTE — Telephone Encounter (Signed)
Pt's daughter is requesting intermittent FMLA forms to be completed to care for her father (pt). Pt was seen at the ER on 10/09/14 for vertigo. Pt's last appt with Dr. Larose Kells was 05/04/14.  Would you like for pt to schedule an OV visit or proceed with forms? Please advise. JG//CMA

## 2014-10-11 NOTE — Telephone Encounter (Signed)
Okay to proceed with forms?

## 2014-10-14 ENCOUNTER — Other Ambulatory Visit: Payer: Self-pay | Admitting: Internal Medicine

## 2014-10-15 NOTE — Telephone Encounter (Signed)
Forms currently with Dr. Larose Kells. JG//CMA

## 2014-10-24 NOTE — Telephone Encounter (Signed)
Forms faxed to [employer] successfully. Sent for scanning. JG/CMA

## 2014-10-26 ENCOUNTER — Ambulatory Visit: Payer: Medicare HMO

## 2014-10-30 ENCOUNTER — Telehealth: Payer: Self-pay

## 2014-10-30 NOTE — Telephone Encounter (Signed)
Appointment scheduled.

## 2014-11-02 ENCOUNTER — Ambulatory Visit (INDEPENDENT_AMBULATORY_CARE_PROVIDER_SITE_OTHER): Payer: Medicare HMO

## 2014-11-02 ENCOUNTER — Telehealth: Payer: Self-pay | Admitting: Internal Medicine

## 2014-11-02 VITALS — BP 122/68 | HR 76 | Ht 69.0 in | Wt 167.6 lb

## 2014-11-02 DIAGNOSIS — Z0000 Encounter for general adult medical examination without abnormal findings: Secondary | ICD-10-CM

## 2014-11-02 DIAGNOSIS — E538 Deficiency of other specified B group vitamins: Secondary | ICD-10-CM

## 2014-11-02 DIAGNOSIS — Z23 Encounter for immunization: Secondary | ICD-10-CM

## 2014-11-02 MED ORDER — CYANOCOBALAMIN 1000 MCG/ML IJ SOLN
1000.0000 ug | Freq: Once | INTRAMUSCULAR | Status: AC
  Administered 2014-11-02: 1000 ug via INTRAMUSCULAR

## 2014-11-02 NOTE — Telephone Encounter (Signed)
Left a message for call back.  

## 2014-11-02 NOTE — Telephone Encounter (Signed)
Spoke with daughter.  She wanted to make [place] aware that patient has been experiencing vertigo and has been stumbling over things.  She says that he has a cyst on his brain and a VP shunt.  The plan is for patient to have surgery.  Therefore patient has an appointment with Dr. Vertell Limber next week.  She says in the meantime patient is taking Meclizine and she wanted to make [place] aware of all this just in case he does not tell us.  I thanked daughter for information and asked that someone bring patient to appointment so he does not have to drive or walk up to office on his own.  Daughter stated that she would see if her niece could bring patient to his appointment.

## 2014-11-02 NOTE — Patient Instructions (Addendum)
Follow up with Dr. Larose Kells 1-2 months.  Scheduled eye exam.      Fall Prevention and Home Safety Falls cause injuries and can affect all age groups. It is possible to prevent falls.  HOW TO PREVENT FALLS  Wear shoes with rubber soles that do not have an opening for your toes.  Keep the inside and outside of your house well lit.  Use night lights throughout your home.  Remove clutter from floors.  Clean up floor spills.  Remove throw rugs or fasten them to the floor with carpet tape.  Do not place electrical cords across pathways.  Put grab bars by your tub, shower, and toilet. Do not use towel bars as grab bars.  Put handrails on both sides of the stairway. Fix loose handrails.  Do not climb on stools or stepladders, if possible.  Do not wax your floors.  Repair uneven or unsafe sidewalks, walkways, or stairs.  Keep items you use a lot within reach.  Be aware of pets.  Keep emergency numbers next to the telephone.  Put smoke detectors in your home and near bedrooms. Ask your doctor what other things you can do to prevent falls. Document Released: 12/20/2008 Document Revised: 08/25/2011 Document Reviewed: 05/26/2011 [facility] Patient Information 2015 [place], [place]. This information is not intended to replace advice given to you by your health care provider. Make sure you discuss any questions you have with your health care provider.  Health Maintenance A healthy lifestyle and preventative care can promote health and wellness.  Maintain regular health, dental, and eye exams.  Eat a healthy diet. Foods like vegetables, fruits, whole grains, low-fat dairy products, and lean protein foods contain the nutrients you need and are low in calories. Decrease your intake of foods high in solid fats, added sugars, and salt. Get information about a proper diet from your health care provider, if necessary.  Regular physical exercise is one of the most important things you can do for  your health. Most adults should get at least 150 minutes of moderate-intensity exercise (any activity that increases your heart rate and causes you to sweat) each week. In addition, most adults need muscle-strengthening exercises on 2 or more days a week.   Maintain a healthy weight. The body mass index (BMI) is a screening tool to identify possible weight problems. It provides an estimate of body fat based on height and weight. Your health care provider can find your BMI and can help you achieve or maintain a healthy weight. For males 20 years and older:  A BMI below 18.5 is considered underweight.  A BMI of 18.5 to 24.9 is normal.  A BMI of 25 to 29.9 is considered overweight.  A BMI of 30 and above is considered obese.  Maintain normal blood lipids and cholesterol by exercising and minimizing your intake of saturated fat. Eat a balanced diet with plenty of fruits and vegetables. Blood tests for lipids and cholesterol should begin at age 23 and be repeated every 5 years. If your lipid or cholesterol levels are high, you are over age 63, or you are at high risk for heart disease, you may need your cholesterol levels checked more frequently.Ongoing high lipid and cholesterol levels should be treated with medicines if diet and exercise are not working.  If you smoke, find out from your health care provider how to quit. If you do not use tobacco, do not start.  Lung cancer screening is recommended for adults aged 52-80 years who  are at high risk for developing lung cancer because of a history of smoking. A yearly low-dose CT scan of the lungs is recommended for people who have at least a 30-pack-year history of smoking and are current smokers or have quit within the past 15 years. A pack year of smoking is smoking an average of 1 pack of cigarettes a day for 1 year (for example, a 30-pack-year history of smoking could mean smoking 1 pack a day for 30 years or 2 packs a day for 15 years). Yearly  screening should continue until the smoker has stopped smoking for at least 15 years. Yearly screening should be stopped for people who develop a health problem that would prevent them from having lung cancer treatment.  If you choose to drink alcohol, do not have more than 2 drinks per day. One drink is considered to be 12 oz (360 mL) of beer, 5 oz (150 mL) of wine, or 1.5 oz (45 mL) of liquor.  Avoid the use of street drugs. Do not share needles with anyone. Ask for help if you need support or instructions about stopping the use of drugs.  High blood pressure causes heart disease and increases the risk of stroke. Blood pressure should be checked at least every 1-2 years. Ongoing high blood pressure should be treated with medicines if weight loss and exercise are not effective.  If you are 7-58 years old, ask your health care provider if you should take aspirin to prevent heart disease.  Diabetes screening involves taking a blood sample to check your fasting blood sugar level. This should be done once every 3 years after age 35 if you are at a normal weight and without risk factors for diabetes. Testing should be considered at a younger age or be carried out more frequently if you are overweight and have at least 1 risk factor for diabetes.  Colorectal cancer can be detected and often prevented. Most routine colorectal cancer screening begins at the age of 10 and continues through age 42. However, your health care provider may recommend screening at an earlier age if you have risk factors for colon cancer. On a yearly basis, your health care provider may provide home test kits to check for hidden blood in the stool. A small camera at the end of a tube may be used to directly examine the colon (sigmoidoscopy or colonoscopy) to detect the earliest forms of colorectal cancer. Talk to your health care provider about this at age 18 when routine screening begins. A direct exam of the colon should be repeated  every 5-10 years through age 71, unless early forms of precancerous polyps or small growths are found.  People who are at an increased risk for hepatitis B should be screened for this virus. You are considered at high risk for hepatitis B if:  You were born in a country where hepatitis B occurs often. Talk with your health care provider about which countries are considered high risk.  Your parents were born in a high-risk country and you have not received a shot to protect against hepatitis B (hepatitis B vaccine).  You have HIV or AIDS.  You use needles to inject street drugs.  You live with, or have sex with, someone who has hepatitis B.  You are a man who has sex with other men (MSM).  You get hemodialysis treatment.  You take certain medicines for conditions like cancer, organ transplantation, and autoimmune conditions.  Hepatitis C blood testing is  recommended for all people born from [birth year] through 1964 and any individual with known risk factors for hepatitis C.  Healthy men should no longer receive prostate-specific antigen (PSA) blood tests as part of routine cancer screening. Talk to your health care provider about prostate cancer screening.  Testicular cancer screening is not recommended for adolescents or adult males who have no symptoms. Screening includes self-exam, a health care provider exam, and other screening tests. Consult with your health care provider about any symptoms you have or any concerns you have about testicular cancer.  Practice safe sex. Use condoms and avoid high-risk sexual practices to reduce the spread of sexually transmitted infections (STIs).  You should be screened for STIs, including gonorrhea and chlamydia if:  You are sexually active and are younger than 24 years.  You are older than 24 years, and your health care provider tells you that you are at risk for this type of infection.  Your sexual activity has changed since you were last screened,  and you are at an increased risk for chlamydia or gonorrhea. Ask your health care provider if you are at risk.  If you are at risk of being infected with HIV, it is recommended that you take a prescription medicine daily to prevent HIV infection. This is called pre-exposure prophylaxis (PrEP). You are considered at risk if:  You are a man who has sex with other men (MSM).  You are a heterosexual man who is sexually active with multiple partners.  You take drugs by injection.  You are sexually active with a partner who has HIV.  Talk with your health care provider about whether you are at high risk of being infected with HIV. If you choose to begin PrEP, you should first be tested for HIV. You should then be tested every 3 months for as long as you are taking PrEP.  Use sunscreen. Apply sunscreen liberally and repeatedly throughout the day. You should seek shade when your shadow is shorter than you. Protect yourself by wearing long sleeves, pants, a wide-brimmed hat, and sunglasses year round whenever you are outdoors.  Tell your health care provider of new moles or changes in moles, especially if there is a change in shape or color. Also, tell your health care provider if a mole is larger than the size of a pencil eraser.  A one-time screening for abdominal aortic aneurysm (AAA) and surgical repair of large AAAs by ultrasound is recommended for men aged 69-75 years who are current or former smokers.  Stay current with your vaccines (immunizations). Document Released: 08/22/2007 Document Revised: 02/28/2013 Document Reviewed: 07/21/2010 [facility] Patient Information 2015 [place], [place]. This information is not intended to replace advice given to you by your health care provider. Make sure you discuss any questions you have with your health care provider.  Smoking Cessation, Tips for Success If you are ready to quit smoking, congratulations! You have chosen to help yourself be healthier.  Cigarettes bring nicotine, tar, carbon monoxide, and other irritants into your body. Your lungs, heart, and blood vessels will be able to work better without these poisons. There are many different ways to quit smoking. Nicotine gum, nicotine patches, a nicotine inhaler, or nicotine nasal spray can help with physical craving. Hypnosis, support groups, and medicines help break the habit of smoking. WHAT THINGS CAN I DO TO MAKE QUITTING EASIER?  Here are some tips to help you quit for good:  Pick a date when you will quit smoking completely. Tell all  of your friends and family about your plan to quit on that date.  Do not try to slowly cut down on the number of cigarettes you are smoking. Pick a quit date and quit smoking completely starting on that day.  Throw away all cigarettes.   Clean and remove all ashtrays from your home, work, and car.  On a card, write down your reasons for quitting. Carry the card with you and read it when you get the urge to smoke.  Cleanse your body of nicotine. Drink enough water and fluids to keep your urine clear or pale yellow. Do this after quitting to flush the nicotine from your body.  Learn to predict your moods. Do not let a bad situation be your excuse to have a cigarette. Some situations in your life might tempt you into wanting a cigarette.  Never have "just one" cigarette. It leads to wanting another and another. Remind yourself of your decision to quit.  Change habits associated with smoking. If you smoked while driving or when feeling stressed, try other activities to replace smoking. Stand up when drinking your coffee. Brush your teeth after eating. Sit in a different chair when you read the paper. Avoid alcohol while trying to quit, and try to drink fewer caffeinated beverages. Alcohol and caffeine may urge you to smoke.  Avoid foods and drinks that can trigger a desire to smoke, such as sugary or spicy foods and alcohol.  Ask people who smoke not  to smoke around you.  Have something planned to do right after eating or having a cup of coffee. For example, plan to take a walk or exercise.  Try a relaxation exercise to calm you down and decrease your stress. Remember, you may be tense and nervous for the first 2 weeks after you quit, but this will pass.  Find new activities to keep your hands busy. Play with a pen, coin, or rubber band. Doodle or draw things on paper.  Brush your teeth right after eating. This will help cut down on the craving for the taste of tobacco after meals. You can also try mouthwash.   Use oral substitutes in place of cigarettes. Try using lemon drops, carrots, cinnamon sticks, or chewing gum. Keep them handy so they are available when you have the urge to smoke.  When you have the urge to smoke, try deep breathing.  Designate your home as a nonsmoking area.  If you are a heavy smoker, ask your health care provider about a prescription for nicotine chewing gum. It can ease your withdrawal from nicotine.  Reward yourself. Set aside the cigarette money you save and buy yourself something nice.  Look for support from others. Join a support group or smoking cessation program. Ask someone at home or at work to help you with your plan to quit smoking.  Always ask yourself, "Do I need this cigarette or is this just a reflex?" Tell yourself, "Today, I choose not to smoke," or "I do not want to smoke." You are reminding yourself of your decision to quit.  Do not replace cigarette smoking with electronic cigarettes (commonly called e-cigarettes). The safety of e-cigarettes is unknown, and some may contain harmful chemicals.  If you relapse, do not give up! Plan ahead and think about what you will do the next time you get the urge to smoke. HOW WILL I FEEL WHEN I QUIT SMOKING? You may have symptoms of withdrawal because your body is used to nicotine (the addictive  substance in cigarettes). You may crave cigarettes, be  irritable, feel very hungry, cough often, get headaches, or have difficulty concentrating. The withdrawal symptoms are only temporary. They are strongest when you first quit but will go away within 10-14 days. When withdrawal symptoms occur, stay in control. Think about your reasons for quitting. Remind yourself that these are signs that your body is healing and getting used to being without cigarettes. Remember that withdrawal symptoms are easier to treat than the major diseases that smoking can cause.  Even after the withdrawal is over, expect periodic urges to smoke. However, these cravings are generally short lived and will go away whether you smoke or not. Do not smoke! WHAT RESOURCES ARE AVAILABLE TO HELP ME QUIT SMOKING? Your health care provider can direct you to community resources or hospitals for support, which may include:  Group support.  Education.  Hypnosis.  Therapy. Document Released: 11/22/2003 Document Revised: 07/10/2013 Document Reviewed: 08/11/2012 [facility] Patient Information 2015 [place], [place]. This information is not intended to replace advice given to you by your health care provider. Make sure you discuss any questions you have with your health care provider.

## 2014-11-02 NOTE — Telephone Encounter (Signed)
Caller name: Townsend Roger  Relation to pt: daughter  Call back number: [phone]   Reason for call:  Townsend Roger states she is POA and would like to discuss patient medicare wellness appt scheduled for today.

## 2014-11-02 NOTE — Progress Notes (Addendum)
Subjective:   Zachary Fields is a 69 y.o. male who presents for Medicare Annual/Subsequent preventive examination.  Review of Systems: NO ROS  Sleep patterns:  Sleeps 8.5 hours/wakes up 1 or 2 times during the night.  Naps during the day.   Home Safety/Smoke Alarms:  Lives with daughter and family.  Smoke detector and carbon monoxide detector present.  Feels safe at home.   Firearm Safety:  No firearms  Seat Belt Safety/Bike Helmet:  Always wears seat belt.      Counseling:   Eye Exam-Plans to schedule an appointment.    Dental- Dentures Male:  CCS- 01/06/13; repeat in 5 years; UTD    PSA-  01/27/13   PNA 13 given today. Vitamin B12 Injection given today.   Objective:    Vitals: BP 122/68 mmHg  Pulse 76  Ht 5\' 9"  (1.753 m)  Wt 167 lb 9.6 oz (76.023 kg)  BMI 24.74 kg/m2  SpO2 97%  Tobacco History  Smoking status  . Current Every Day Smoker -- 1.50 packs/day  . Types: Cigarettes  Smokeless tobacco  . Never Used    Comment: ~ 1.5 ppd      Ready to quit: No Counseling given: Yes   Past Medical History  Diagnosis Date  . Hypertension   . Anemia   . 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 CRI (chronic renal insufficiency)   . Depression   . Hyperhomocysteinemia   . 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 Vertell Limber in 1997. Shunt replaced 06-2008  . Dizziness     Chronic paresthesias and dizziness not related with stroke  . Renal cyst     08-2009 Renal cysts( R ) at outside hospital (seen also in a u/s 2007), [facility] for f/u u/s 11-11  . Deaf     L ear, poor hearing R   . 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 Diverticulosis   . Colon polyps   . 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 BPH (benign prostatic hyperplasia) 12/12/2010  . Enlarged prostate with lower urinary tract symptoms (LUTS) 06/2014    Dr. Karsten Ro  . Microscopic hematuria   .  Hyperhomocysteinemia   . 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 Scr returned to baseline of 1.9 on 02/17/2013, Dr. Quincy Simmonds  . Brain cyst   . Vertigo    Past Surgical History  Procedure Laterality Date  . Laparotomy      s/p removal of swallowed of a FB from the stomach when he was 69 y/o    . Brain surgery  several    s/p a VP shunt, revised  by Dr. Vertell Limber in 1997. Shunt replaced 06-2008.   Family History  Problem Relation Age of Onset  . Colon cancer Mother 60    colon  . Cancer Brother     bladder  . Prostate cancer Neg Hx   . Diabetes Neg Hx   . Stroke Neg Hx   . Coronary artery disease      Uncle MI at age 94s   History  Sexual Activity  . Sexual Activity: Not on file    Outpatient Encounter Prescriptions as of 11/02/2014  Medication Sig  .  albuterol (PROVENTIL HFA;VENTOLIN HFA) 108 (90 BASE) MCG/ACT inhaler 2 puffs twice daily for 4-5 days, then q6h PRN cough  . aspirin 325 MG tablet Take 325 mg by mouth daily.    . cyanocobalamin (,VITAMIN B-12,) 1000 MCG/ML injection Inject 1 mL (1,000 mcg total) into the muscle every 30 (thirty) days. Diagnosis 280.1  . felodipine (PLENDIL) 10 MG 24 hr tablet Take 1 tablet (10 mg total) by mouth daily.  . Fluticasone-Salmeterol (ADVAIR) 100-50 MCG/DOSE AEPB Inhale 1 puff into the lungs 2 (two) times daily.  Marland Kitchen FOLIC ACID PO Take 1 tablet by mouth daily.  . furosemide (LASIX) 40 MG tablet Take 1 tablet (40 mg total) by mouth daily.  . meclizine (ANTIVERT) 25 MG tablet Take 1 tablet (25 mg total) by mouth 3 (three) times daily as needed for dizziness.  . tamsulosin (FLOMAX) 0.4 MG CAPS capsule Take 1 capsule (0.4 mg total) by mouth daily after supper.  . [DISCONTINUED] benzonatate (TESSALON) 200 MG capsule Take 1 capsule (200 mg total) by mouth 3 (three) times daily as needed for cough. (Patient not taking: Reported on 06/29/2014)  . [DISCONTINUED]  Fluticasone-Salmeterol (ADVAIR) 100-50 MCG/DOSE AEPB Inhale 1 puff into the lungs 2 (two) times daily.  . [EXPIRED] cyanocobalamin ((VITAMIN B-12)) injection 1,000 mcg    No facility-administered encounter medications on file as of 11/02/2014.    Activities of Daily Living In your present state of health, do you have any difficulty performing the following activities: 11/02/2014  Hearing? Y  Vision? Y  Difficulty concentrating or making decisions? N  Walking or climbing stairs? Y  Dressing or bathing? N  Doing errands, shopping? Y  Preparing Food and eating ? Y  Using the Toilet? Y  In the past six months, have you accidently leaked urine? N  Do you have problems with loss of bowel control? N  Managing your Medications? N  Managing your Finances? Y  Housekeeping or managing your Housekeeping? N    Patient Care Team: Colon Branch, MD as PCP - General Kathie Rhodes, MD as Consulting Physician (Urology) Donato Heinz, MD as Consulting Physician (Nephrology) Erline Levine, MD as Consulting Physician (Neurosurgery)   Assessment:  COPD:  Chronic.  Stable.  On albuterol inhaler as needed. Smoker 1.5 pks per day.  No interested in quitting at this time.    HTN: Stable.  Well controlled with medications.    Vertigo:  Was seen in ER on 10/09/14.  MRI-No acute infarct.  Enlargement of the cystic portion of the pineal region mass compared to the prior MRI.  Following with Dr. Vertell Limber.     Exercise Activities and Dietary recommendations  BMI: 24.74 kg/m2  Current Exercise Habits:: Exercise is limited by, Limited by:: neurologic condition(s)   Diet:  No breakfast.  Not much lunch.  Not much dinner.  Granddaughter states he typically eats half of what a normal person would eat.     Goals    . Pt states he would like to still be alive next year.        Fall Risk Fall Risk  11/02/2014 01/27/2013  Falls in the past year? Yes No  Number falls in past yr: 2 or more -  Injury with Fall? No  -  Risk Factor Category  High Fall Risk -  Risk for fall due to : Impaired balance/gait;Impaired mobility;Impaired vision -  Follow up Follow up appointment -   Depression Screen PHQ 2/9 Scores 11/02/2014 01/27/2013  PHQ - 2 Score 3 0  PHQ- 9 Score 12 -  Pt denies suicidal or homicidal ideations.  Declines seeing a therapist.  Not currently on medication.  Pt advised to follow up with Dr. Larose Kells.     Cognitive Testing MMSE - Mini Mental State Exam 11/02/2014  Orientation to time 5  Orientation to Place 5  Registration 3  Attention/ Calculation 5  Recall 3  Language- name 2 objects 2  Language- repeat 1  Language- follow 3 step command 3  Language- read & follow direction 1  Write a sentence 1  Copy design 1  Total score 30    Immunization History  Administered Date(s) Administered  . Influenza Split 12/12/2010  . Influenza Whole 12/16/2007  . Influenza, High Dose Seasonal PF 01/27/2013  . Pneumococcal Conjugate-13 11/02/2014  . Pneumococcal Polysaccharide-23 04/30/2009  . Td 11/07/2005  . Zoster 12/12/2010   Screening Tests Health Maintenance  Topic Date Due  . Hepatitis C Screening  12/31/2014 (Originally [birth year])  . INFLUENZA VACCINE  11/18/2015 (Originally 10/08/2014)  . PNA vac Low Risk Adult (2 of 2 - PPSV23) 11/02/2015  . TETANUS/TDAP  11/08/2015  . COLONOSCOPY  01/06/2018  . ZOSTAVAX  Completed    Plan:  Follow up with Dr. Larose Kells in 1-2 months for chronic medical conditions and symptoms of depression.    Schedule eye exam.     During the course of the visit the patient was educated and counseled about the following appropriate screening and preventive services:   Vaccines to include Pneumoccal, Influenza, Hepatitis B, Td, Zostavax, HCV  Electrocardiogram  Cardiovascular Disease  Colorectal cancer screening  Diabetes screening  Prostate Cancer Screening  Glaucoma screening  Nutrition counseling   Smoking cessation counseling  Patient  Instructions (the written plan) was given to the patient.    Rudene Anda, RN  11/02/2014   Agree with assessment as stated above French Ana MD

## 2014-11-05 NOTE — Progress Notes (Signed)
Pre visit review using our clinic review tool, if applicable. No additional management support is needed unless otherwise documented below in the visit note. 

## 2014-11-12 ENCOUNTER — Other Ambulatory Visit: Payer: Self-pay | Admitting: Internal Medicine

## 2014-11-19 ENCOUNTER — Ambulatory Visit (INDEPENDENT_AMBULATORY_CARE_PROVIDER_SITE_OTHER): Payer: Medicare HMO | Admitting: Internal Medicine

## 2014-11-19 ENCOUNTER — Encounter: Payer: Self-pay | Admitting: Internal Medicine

## 2014-11-19 VITALS — BP 122/74 | HR 70 | Temp 98.3°F | Ht 68.0 in | Wt 166.1 lb

## 2014-11-19 DIAGNOSIS — E538 Deficiency of other specified B group vitamins: Secondary | ICD-10-CM

## 2014-11-19 DIAGNOSIS — R1084 Generalized abdominal pain: Secondary | ICD-10-CM

## 2014-11-19 DIAGNOSIS — D649 Anemia, unspecified: Secondary | ICD-10-CM | POA: Diagnosis not present

## 2014-11-19 DIAGNOSIS — Z23 Encounter for immunization: Secondary | ICD-10-CM

## 2014-11-19 DIAGNOSIS — Z09 Encounter for follow-up examination after completed treatment for conditions other than malignant neoplasm: Secondary | ICD-10-CM

## 2014-11-19 DIAGNOSIS — E785 Hyperlipidemia, unspecified: Secondary | ICD-10-CM

## 2014-11-19 MED ORDER — CYANOCOBALAMIN 1000 MCG/ML IJ SOLN
1000.0000 ug | Freq: Once | INTRAMUSCULAR | Status: AC
  Administered 2014-11-19: 1000 ug via INTRAMUSCULAR

## 2014-11-19 MED ORDER — DICYCLOMINE HCL 10 MG PO CAPS: 10.0000 mg | ORAL_CAPSULE | Freq: Three times a day (TID) | ORAL | Status: DC | PRN

## 2014-11-19 NOTE — Progress Notes (Signed)
Pre visit review using our clinic review tool, if applicable. No additional management support is needed unless otherwise documented below in the visit note. 

## 2014-11-19 NOTE — Progress Notes (Signed)
Subjective:    Patient ID: Zachary Fields, male    DOB: [birth year], 70 y.o.   MRN: [id number]  DOS:  11/19/2014 Type of visit - description : Acute visit Interval history: 6 months history of pain and the lower abdomen on and off. This has happened in 2 or 3 times a day, lasts less than 1 minute, is described as a intense twisting pain at the bilateral lower abdomen. Sx are  a day or night, not associated with nausea, not follow-up by a BM. Since he is here, we reviewed the chart: Was seen at the ER with dizziness and subsequently seen by Dr. Vertell Limber, now doing better. History of hypertension: Good compliance of medication, BP today is very good He received IV iron last year, last hemoglobin stable.  Review of Systems  denies fever chills. No weight loss Appetite at baseline, poor most days. No blood in the stools. No dysuria, gross hematuria. Occasionally has difficulty urinating but had a complete evaluation by urology this year..   Past Medical History  Diagnosis Date  . Hypertension   . Anemia   . 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 CRI (chronic renal insufficiency)   . Depression   . Hyperhomocysteinemia   . 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 Vertell Limber in 1997. Shunt replaced 06-2008  . Dizziness     Chronic paresthesias and dizziness not related with stroke  . Renal cyst     08-2009 Renal cysts( R ) at outside hospital (seen also in a u/s 2007), [facility] for f/u u/s 11-11  . Deaf     L ear, poor hearing R   . 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 Diverticulosis   . Colon polyps   . 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 BPH (benign prostatic hyperplasia) 12/12/2010  . Enlarged prostate with lower urinary tract symptoms (LUTS) 06/2014    Dr. Karsten Ro  . Microscopic hematuria   . Hyperhomocysteinemia   . Peripheral edema     Trace edema, no proteinuria  on dipstick, possibly related to CCB, negative serologies, neg SPEP/UPEP. Scr returned to baseline of 1.9 on 02/17/2013, Dr. Quincy Simmonds  . Brain cyst   . Vertigo     Past Surgical History  Procedure Laterality Date  . Laparotomy      s/p removal of swallowed of a FB from the stomach when he was 69 y/o    . Brain surgery  several    s/p a VP shunt, revised  by Dr. Vertell Limber in 1997. Shunt replaced 06-2008.    Social History   Social History  . Marital Status: Divorced    Spouse Name: N/A  . Number of Children: 2  . Years of Education: N/A   Occupational History  . accountant    Social History Main Topics  . Smoking status: Current Every Day Smoker -- 1.50 packs/day    Types: Cigarettes  . Smokeless tobacco: Never Used     Comment: ~ 1.5 ppd   . Alcohol Use: No  . Drug Use: No  . Sexual Activity: Not on file   Other Topics Concern  . Not on file   Social History Narrative   Works in [place], 2 daughters ([name]), lives w/ [place]  Medication List       This list is accurate as of: 11/19/14 11:59 PM.  Always use your most recent med list.               albuterol 108 (90 BASE) MCG/ACT inhaler  Commonly known as:  PROVENTIL HFA;VENTOLIN HFA  2 puffs twice daily for 4-5 days, then q6h PRN cough     aspirin 325 MG tablet  Take 325 mg by mouth daily.     cyanocobalamin 1000 MCG/ML injection  Commonly known as:  (VITAMIN B-12)  Inject 1 mL (1,000 mcg total) into the muscle every 30 (thirty) days. Diagnosis 280.1     dicyclomine 10 MG capsule  Commonly known as:  BENTYL  Take 1 capsule (10 mg total) by mouth 3 (three) times daily as needed for spasms.     felodipine 10 MG 24 hr tablet  Commonly known as:  PLENDIL  Take 1 tablet (10 mg total) by mouth daily.     Fluticasone-Salmeterol 100-50 MCG/DOSE Aepb  Commonly known as:  ADVAIR  Inhale 1 puff into the lungs 2 (two) times daily.     FOLIC ACID PO  Take 1 tablet by mouth daily.     furosemide  40 MG tablet  Commonly known as:  LASIX  Take 1 tablet (40 mg total) by mouth daily.     meclizine 25 MG tablet  Commonly known as:  ANTIVERT  Take 1 tablet (25 mg total) by mouth 3 (three) times daily as needed for dizziness.     tamsulosin 0.4 MG Caps capsule  Commonly known as:  FLOMAX  Take 1 capsule (0.4 mg total) by mouth daily after supper.           Objective:   Physical Exam BP 122/74 mmHg  Pulse 70  Temp(Src) 98.3 F (36.8 C) (Oral)  Ht 5\' 8"  (1.727 m)  Wt 166 lb 2 oz (75.354 kg)  BMI 25.27 kg/m2  SpO2 95% General:   Well developed, well nourished . NAD.  HEENT:  Normocephalic . Face asymmetric, at baseline Lungs:  CTA B Normal respiratory effort, no intercostal retractions, no accessory muscle use. Heart: RRR,  no murmur.  no pretibial edema bilaterally  Abdomen:  Not distended, soft, non-tender. No rebound or rigidity. No mass,organomegaly Skin: Not pale. Not jaundice Neurologic:  Speech normal, gait assisted by a cane and slightly unsteady  Psych--  Behavior appropriate. No anxious or depressed appearing.    Assessment & Plan:   Problem list > Hypertension Renal: --Chronic renal insufficiency --Renal cysts: MRI 01-2012 ; last [place] 05-2013 (L 15 mm lesion stable, ? Of new 15 mm L lesion) --Edema --Anemia LUTS, BPH: Sees urology, in 2016 had a cystoscopy, ultrasound and urodynamics, DX with outlet obstruction, prescribed Flomax Depression Hyper homocystinemia ? 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 No red flags such  as weight loss or blood in the stools. Plan: Check a UA, trial with Bentyl. Chronic renal insufficiency: Last creatinine 2.3. At baseline Hypertension: Well-controlled History of CVA: No recent cholesterol panel, will do  that today. Neurology: Recently saw at  the ER and subsequently saw Dr. Vertell Limber Anemia: I did notice he received IV iron approximately 12-2013, rx by nephrology? Recent hemoglobin is stable, he was iron deficient, recheck labs. Primary care:  High-dose flu shot RTC 4 months

## 2014-11-19 NOTE — Patient Instructions (Signed)
Get your blood work before you leave  take Bentyl 1 tablet 3 times a day as needed for pain   Next visit  for a   checkup in 4 months Please schedule an appointment at the front desk   Fasting is optional

## 2014-11-20 DIAGNOSIS — Z09 Encounter for follow-up examination after completed treatment for conditions other than malignant neoplasm: Secondary | ICD-10-CM | POA: Insufficient documentation

## 2014-11-20 LAB — FERRITIN: FERRITIN: 9.4 ng/mL — AB (ref 22.0–322.0)

## 2014-11-20 LAB — URINALYSIS, ROUTINE W REFLEX MICROSCOPIC
Bilirubin Urine: NEGATIVE
KETONES UR: NEGATIVE
LEUKOCYTES UA: NEGATIVE
Nitrite: NEGATIVE
Specific Gravity, Urine: 1.025 (ref 1.000–1.030)
Urine Glucose: NEGATIVE
Urobilinogen, UA: 0.2 (ref 0.0–1.0)
pH: 6 (ref 5.0–8.0)

## 2014-11-20 LAB — LIPID PANEL
Cholesterol: 99 mg/dL (ref 0–200)
HDL: 53.2 mg/dL (ref >39.00)
LDL Cholesterol: 30 mg/dL (ref 0–99)
NonHDL: 45.91
Total CHOL/HDL Ratio: 2
Triglycerides: 78 mg/dL (ref 0.0–149.0)
VLDL: 15.6 mg/dL (ref 0.0–40.0)

## 2014-11-20 LAB — IRON AND TIBC
%SAT: 12 % — AB (ref 15–60)
IRON: 51 ug/dL (ref 50–180)
TIBC: 442 ug/dL — ABNORMAL HIGH (ref 250–425)
UIBC: 391 ug/dL (ref 125–400)

## 2014-11-20 LAB — IRON: IRON: 57 ug/dL (ref 42–165)

## 2014-11-20 LAB — URINE CULTURE

## 2014-11-20 NOTE — Assessment & Plan Note (Signed)
Abdominal pain: Abdominal pain as described above, he is up-to-date on his colonoscopies, description sounds like a colon spasm. No red flags such as weight loss or blood in the stools. Plan: Check a UA, trial with Bentyl. Chronic renal insufficiency: Last creatinine 2.3. At baseline Hypertension: Well-controlled History of CVA: No recent cholesterol panel, will do that today. Neurology: Recently saw at  the ER and subsequently saw Dr. Vertell Limber Anemia: I did notice he received IV iron approximately 12-2013, rx by nephrology? Recent hemoglobin is stable, he was iron deficient, recheck labs. Primary care:  High-dose flu shot RTC 4 months

## 2014-11-23 ENCOUNTER — Ambulatory Visit: Payer: Medicare HMO

## 2014-11-26 MED ORDER — FERROUS SULFATE 325 (65 FE) MG PO TABS: 325.0000 mg | ORAL_TABLET | Freq: Two times a day (BID) | ORAL | Status: DC

## 2014-11-26 NOTE — Addendum Note (Signed)
Addended by: Wilfrid Lund on: 11/26/2014 09:51 AM   Modules accepted: Orders

## 2014-12-07 ENCOUNTER — Ambulatory Visit: Payer: Medicare HMO | Attending: Neurosurgery

## 2014-12-07 DIAGNOSIS — R42 Dizziness and giddiness: Secondary | ICD-10-CM | POA: Insufficient documentation

## 2014-12-07 DIAGNOSIS — R269 Unspecified abnormalities of gait and mobility: Secondary | ICD-10-CM | POA: Diagnosis present

## 2014-12-07 DIAGNOSIS — R2681 Unsteadiness on feet: Secondary | ICD-10-CM | POA: Diagnosis present

## 2014-12-07 NOTE — Therapy (Signed)
[facility] [address] [place], [place], [address] Phone: [phone]   Fax:  [phone]  Physical Therapy Evaluation  Patient Details  Name: Zachary Fields MRN: [id number] Date of Birth: 1946/01/08 Referring Peirce Deveney:  Erline Levine, MD  Encounter Date: 12/07/2014      PT End of Session - 12/07/14 2019    Visit Number 1   Number of Visits 9   Date for PT Re-Evaluation 01/06/15   Authorization Type G-code every 10th visit.    PT Start Time 1402   PT Stop Time 1445   PT Time Calculation (min) 43 min   Equipment Utilized During Treatment Gait belt   Activity Tolerance Patient tolerated treatment well   Behavior During Therapy --  pt with intermittent bouts of laughter when discussing falls or other serious medical issues      Past Medical History  Diagnosis Date  . Hypertension   . Anemia   . 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 CRI (chronic renal insufficiency)   . Depression   . Hyperhomocysteinemia   . 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 Vertell Limber in 1997. Shunt replaced 06-2008  . Dizziness     Chronic paresthesias and dizziness not related with stroke  . Renal cyst     08-2009 Renal cysts( R ) at outside hospital (seen also in a u/s 2007), [facility] for f/u u/s 11-11  . Deaf     L ear, poor hearing R   . 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 Diverticulosis   . Colon polyps   . 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 BPH (benign prostatic hyperplasia) 12/12/2010  . Enlarged prostate with lower urinary tract symptoms (LUTS) 06/2014    Dr. Karsten Ro  . Microscopic hematuria   . Hyperhomocysteinemia   . 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 Scr returned to baseline of 1.9 on 02/17/2013, Dr. Quincy Simmonds  .  Brain cyst   . Vertigo     Past Surgical History  Procedure Laterality Date  . Laparotomy      s/p removal of swallowed of a FB from the stomach when he was 69 y/o    . Brain surgery  several    s/p a VP shunt, revised  by Dr. Vertell Limber in 1997. Shunt replaced 06-2008.    There were no vitals filed for this visit.  Visit Diagnosis:  Abnormality of gait - Plan: PT plan of care cert/re-cert  Dizziness and giddiness - Plan: PT plan of care cert/re-cert  Unsteadiness - Plan: PT plan of care cert/re-cert      Subjective Assessment - 12/07/14 1414    Subjective Prior to starting eval, pt told admin staff he didn't know if he'd stay for PT, as he didn't think it could help him. PT discussed what eval entailed and that PT could help to improve balance and strength, as pt reported falling 2/2 R side "giving out". Pt reports R-sided weakness is from 2008 and 2013 CVA. Pt reports SPC has prevented many falls. Pt reported he believes he had PT here at [facility] neuro after CVA but can't recall due to impaired memory. Pt reports decreased memory due  to brain tumor.  Pt reports tinnitus and deafness in  L ear which began "for no reason" in 1971. Pt reports episode of dizziness occur when driving and feels everything "wooshing by". Dizziness can also occur when sitting. Swooshing vs. spinning. Dizziness began several months ago and pt is unable to rate dizziness. Pt reported dizziness lasts for about an hour but initial swooshing last for a few seconds.    Pertinent History HTN, h/o CVA, impaired memory, L ear deafness and tinnitus, brain tumor with shunt (shunt replaced in 2010)   Patient Stated Goals Walk with better balance.   Currently in Pain? No/denies            [facility] PT Assessment - 12/07/14 1420    Assessment   Medical Diagnosis Frequent falls   Onset Date/Surgical Date 09/06/14   Prior Therapy Pt believes he had OPPT neuro after last CVA, but cannot recall due to impaired memory.   Precautions    Precautions Fall   Precaution Comments based on DGI score   Restrictions   Weight Bearing Restrictions No   Balance Screen   Has the patient fallen in the past 6 months Yes   How many times? 3  significant falls, but many close calls   Has the patient had a decrease in activity level because of a fear of falling?  No   Is the patient reluctant to leave their home because of a fear of falling?  No   Home Environment   Living Environment Private residence   Living Arrangements Children  dtr   Available Help at Discharge Family   Type of [place] to enter   Entrance Stairs-Number of Steps 2-3   Entrance Stairs-Rails None   Home Layout One level   [place] - single point   Prior Function   Level of Independence Requires assistive device for independence;Independent with basic ADLs;Independent with household mobility with device;Independent with community mobility with device   Vocation Full time employment   Vocation Requirements accounting   Leisure hanging out grandchildren and children   Cognition   Overall Cognitive Status Impaired/Different from baseline   Area of Impairment Memory   Memory Comments Pt reports he has memory issues due to brain tumors   Behaviors Other (comment)  Pt with bouts of laughter while discussing serious matters   Sensation   Light Touch Impaired by gross assessment   Additional Comments Decreased light touch R UE/LE   Coordination   Gross Motor Movements are Fluid and Coordinated Yes   Fine Motor Movements are Fluid and Coordinated No   Finger Nose Finger Test R finger past pointing   Posture/Postural Control   Posture/Postural Control Postural limitations   Postural Limitations Rounded Shoulders;Forward head   ROM / Strength   AROM / PROM / Strength AROM;Strength   AROM   Overall AROM  Within functional limits for tasks performed   Strength   Overall Strength Within functional limits for tasks performed    Overall Strength Comments B UE/LE WFL. However, pt did report R knee pain during R knee ext MMT. Pt also experienced R knee buckling at end of session, indicating decr. Muscle endurance/strength during functional activities.   Transfers   Transfers Sit to Stand;Stand to Sit   Sit to Stand 5: Supervision;With upper extremity assist;From chair/3-in-1   Stand to Sit 5: Supervision;With upper extremity assist;To chair/3-in-1   Ambulation/Gait   Ambulation/Gait Yes   Ambulation/Gait Assistance 5:  Supervision;4: Min guard   Ambulation/Gait Assistance Details Min guard during turns.   Ambulation Distance (Feet) 100 Feet   Assistive device Straight cane   Gait Pattern Step-through pattern;Decreased stride length;Narrow base of support  L hip appears to be elevated during gait   Ambulation Surface Level;Indoor   Gait velocity 2.5ft/sec  with [facility]   Balance   Balance Assessed Yes   Standardized Balance Assessment   Standardized Balance Assessment Timed Up and Go Test;Dynamic Gait Index   Dynamic Gait Index   Level Surface Normal   Change in Gait Speed Normal   Gait with Horizontal Head Turns Mild Impairment   Gait with Vertical Head Turns Mild Impairment   Gait and Pivot Turn Moderate Impairment   Step Over Obstacle Mild Impairment   Step Around Obstacles Normal   Steps Mild Impairment   Total Score 18   Timed Up and Go Test   TUG Normal TUG   Normal TUG (seconds) 12.67  with [facility]                           PT Education - 12/07/14 2018    Education provided Yes   Education Details Benefits of PT. Also, discussed  PT frequency/duration and what types of exercises PT would consist of, including vestibular assessment to determine potential cause of dizziness.   Person(s) Educated Patient   Methods Explanation   Comprehension Verbalized understanding          PT Short Term Goals - 12/07/14 2028    PT SHORT TERM GOAL #1   Title same as LTGs           PT Long  Term Goals - 12/07/14 2028    PT LONG TERM GOAL #1   Title Pt will be IND in HEP to improve balance and endurance and decr. dizziness. Target date: 01/04/15.   Status New   PT LONG TERM GOAL #2   Title Pt will improve DGI score to >/20/24 to decr. falls risk. Target date: 01/04/15.   Status New   PT LONG TERM GOAL #3   Title Pt will report no dizziness (0/10) during turns or while driving to perform ADLs in safe manner. Target date: 01/04/15.   Status New   PT LONG TERM GOAL #4   Title Pt will amb. 600' over even/uneven terrain with LRAD at MOD I level to improve functional mobility. Target date: 01/04/15.   Status New   PT LONG TERM GOAL #5   Title Perform SOT and write goal if appropriate. Target date: 01/04/15.   Status New               Plan - 12/07/14 2020    Clinical Impression Statement Pt is a 69y/o male presenting to OPPT neuro with h/o CVA in 2008 and 2013, pt also with h/o brain tumor and shunt replacement. PT spend first 5-7 minutes of eval explaining what PT eval entailed and the benefits of PT, as pt did not feel that PT could help him. PT made it clear that it was pt's decision to participate in eval, pt agreed. Upon eval, pt presented with gait deviations and impaired balance. Pt's B LE strength was [facility], however, pt did experience gait deviations and one episode of R knee buckling during amb., indicating decr. endurance. Pt also presented with R UE/LE N/T and dizziness. However, pt unable to rate dizziness. PT will complete vestibular assessment next session to determine  potential cause of dizziness and treat as indicated.    Pt will benefit from skilled therapeutic intervention in order to improve on the following deficits Abnormal gait;Decreased endurance;Decreased knowledge of use of DME;Decreased balance;Decreased mobility;Decreased cognition;Decreased safety awareness;Postural dysfunction;Dizziness;Decreased strength   Rehab Potential Good   Clinical Impairments  Affecting Rehab Potential memory impairments    PT Frequency 2x / week   PT Duration 4 weeks   PT Treatment/Interventions ADLs/Self Care Home Management;Biofeedback;Canalith Repostioning;Vestibular;Manual techniques;Balance training;Therapeutic exercise;Therapeutic activities;Functional mobility training;Stair training;Gait training;DME Instruction;Patient/family education;Cognitive remediation;Neuromuscular re-education   PT Next Visit Plan Vestibular assessment and SOT; initiate balance HEP   Consulted and Agree with Plan of Care Patient          G-Codes - Dec 20, 2014 06-20-29    Functional Assessment Tool Used DGI: 18/24   Functional Limitation Mobility: Walking and moving around   Mobility: Walking and Moving Around Current Status [phone]) At least 40 percent but less than 60 percent impaired, limited or restricted   Mobility: Walking and Moving Around Goal Status [phone]) At least 1 percent but less than 20 percent impaired, limited or restricted       Problem List Patient Active Problem List   Diagnosis Date Noted  . Follow-up -------PCP notes  11/20/2014  . COLD (chronic obstructive lung disease) 05/11/2014  . Cough 05/01/2014  . Decreased hearing of right ear 01/22/2014  . Right sided sciatica 12/27/2013  . Gait instability 12/27/2013  . Elevated lipase 10/21/2012  . Medicare annual wellness visit, subsequent 12/12/2010  . BPH (benign prostatic hyperplasia) 12/12/2010  . COPD (chronic obstructive pulmonary disease) 12/12/2010  . RENAL CYST 08/30/2009  . HYPERHOMOCYSTEINEMIA 09/30/2007  . Memory loss 09/30/2007  . RENAL INSUFFICIENCY 03/21/2007  . TOBACCO ABUSE 11/02/2006  . HYPERTENSION 11/02/2006  . CVA 11/02/2006  . NEOP, BEN NOS 03/30/2006  . DEAFNESS, UNILATERAL 03/30/2006    Miller,Jennifer L 12-20-14, 8:33 PM  [facility] [address] [place], [place], [address] Phone: [phone]   Fax:   [phone]    Geoffry Paradise, PT,DPT 20-Dec-2014 8:34 PM Phone: [phone] Fax: [phone]

## 2014-12-08 ENCOUNTER — Other Ambulatory Visit: Payer: Self-pay | Admitting: Internal Medicine

## 2014-12-14 ENCOUNTER — Other Ambulatory Visit: Payer: Self-pay

## 2014-12-14 MED ORDER — TAMSULOSIN HCL 0.4 MG PO CAPS: 0.4000 mg | ORAL_CAPSULE | Freq: Every day | ORAL | Status: DC

## 2014-12-21 ENCOUNTER — Ambulatory Visit (INDEPENDENT_AMBULATORY_CARE_PROVIDER_SITE_OTHER): Payer: Medicare HMO | Admitting: Behavioral Health

## 2014-12-21 DIAGNOSIS — E538 Deficiency of other specified B group vitamins: Secondary | ICD-10-CM

## 2014-12-21 MED ORDER — CYANOCOBALAMIN 1000 MCG/ML IJ SOLN
1000.0000 ug | Freq: Once | INTRAMUSCULAR | Status: AC
  Administered 2014-12-21: 1000 ug via INTRAMUSCULAR

## 2014-12-21 NOTE — Progress Notes (Signed)
Pre visit review using our clinic review tool, if applicable. No additional management support is needed unless otherwise documented below in the visit note.  Patient tolerated injection well.  Next injection scheduled for 01/25/15 at 10:30 AM.

## 2015-01-01 ENCOUNTER — Telehealth: Payer: Self-pay | Admitting: Internal Medicine

## 2015-01-01 MED ORDER — FERROUS SULFATE 325 (65 FE) MG PO TABS: 325.0000 mg | ORAL_TABLET | Freq: Two times a day (BID) | ORAL | Status: DC

## 2015-01-01 MED ORDER — DICYCLOMINE HCL 10 MG PO CAPS: 10.0000 mg | ORAL_CAPSULE | Freq: Three times a day (TID) | ORAL | Status: AC | PRN

## 2015-01-01 NOTE — Telephone Encounter (Signed)
Appt cancelled for 01/04/2015 since Pt was seen last month, instructed Pt to F/U in January, offered to schedule appt. Pt declined at this time.

## 2015-01-01 NOTE — Telephone Encounter (Signed)
Relation to QI:TUYW  Call back number: [phone]  Pharmacy: [id number] - [place], [place] [phone] (Phone) [phone] (Fax)         Reason for call: patient would like to know if she should continue take the following medication and if so requesting a refill dicyclomine (BENTYL) 10 MG capsule and ferrous sulfate 325 (65 FE) MG tablet

## 2015-01-01 NOTE — Telephone Encounter (Signed)
Continue with iron, send a refill if needed. Bentyl is for abdominal pain, to take as needed only.send a Rf if necessary

## 2015-01-01 NOTE — Telephone Encounter (Signed)
Spoke with Pt, informed him to continue Ferrous Sulfate and that he can use Bentyl as needed for abdominal pain. Pt requesting refills on both medications. [facility]'s [facility] [address].

## 2015-01-01 NOTE — Telephone Encounter (Signed)
Please advise 

## 2015-01-04 ENCOUNTER — Ambulatory Visit: Payer: Medicare HMO | Admitting: Internal Medicine

## 2015-01-09 ENCOUNTER — Telehealth: Payer: Self-pay | Admitting: Internal Medicine

## 2015-01-09 NOTE — Telephone Encounter (Signed)
Ok, will see him tomorrow

## 2015-01-09 NOTE — Telephone Encounter (Signed)
Did follow up call with patient regarding dark stools. States he has had for 2 months. States he is also experiencing stomach pain which he is being treated for. States stomach pain is not as bad as it was. Patient states he is also feeling weak and tired all the time. Has appointment scheduled for tomorrow at 8:45 a.m.

## 2015-01-09 NOTE — Telephone Encounter (Signed)
Pt daughter called in to schedule appt for Friday. She said pt is having dark almost black stools. Advised her a nurse will call pt to discuss. Best # to reach pt [phone].

## 2015-01-11 ENCOUNTER — Encounter: Payer: Self-pay | Admitting: Internal Medicine

## 2015-01-11 ENCOUNTER — Ambulatory Visit (INDEPENDENT_AMBULATORY_CARE_PROVIDER_SITE_OTHER): Payer: Medicare HMO | Admitting: Internal Medicine

## 2015-01-11 VITALS — BP 122/74 | HR 75 | Temp 98.0°F | Ht 68.0 in | Wt 165.4 lb

## 2015-01-11 DIAGNOSIS — D649 Anemia, unspecified: Secondary | ICD-10-CM

## 2015-01-11 DIAGNOSIS — K921 Melena: Secondary | ICD-10-CM

## 2015-01-11 DIAGNOSIS — N400 Enlarged prostate without lower urinary tract symptoms: Secondary | ICD-10-CM

## 2015-01-11 DIAGNOSIS — R3989 Other symptoms and signs involving the genitourinary system: Secondary | ICD-10-CM | POA: Diagnosis not present

## 2015-01-11 DIAGNOSIS — Z09 Encounter for follow-up examination after completed treatment for conditions other than malignant neoplasm: Secondary | ICD-10-CM

## 2015-01-11 DIAGNOSIS — Z1159 Encounter for screening for other viral diseases: Secondary | ICD-10-CM

## 2015-01-11 LAB — CBC WITH DIFFERENTIAL/PLATELET
BASOS PCT: 0 % (ref 0–1)
Basophils Absolute: 0 10*3/uL (ref 0.0–0.1)
Eosinophils Absolute: 0.1 10*3/uL (ref 0.0–0.7)
Eosinophils Relative: 2 % (ref 0–5)
HEMATOCRIT: 44.4 % (ref 39.0–52.0)
HEMOGLOBIN: 15.3 g/dL (ref 13.0–17.0)
LYMPHS ABS: 1.8 10*3/uL (ref 0.7–4.0)
Lymphocytes Relative: 26 % (ref 12–46)
MCH: 29.1 pg (ref 26.0–34.0)
MCHC: 34.5 g/dL (ref 30.0–36.0)
MCV: 84.4 fL (ref 78.0–100.0)
MONO ABS: 0.5 10*3/uL (ref 0.1–1.0)
MPV: 9.9 fL (ref 8.6–12.4)
Monocytes Relative: 7 % (ref 3–12)
Neutro Abs: 4.6 10*3/uL (ref 1.7–7.7)
Neutrophils Relative %: 65 % (ref 43–77)
Platelets: 260 10*3/uL (ref 150–400)
RBC: 5.26 MIL/uL (ref 4.22–5.81)
RDW: 16.1 % — ABNORMAL HIGH (ref 11.5–15.5)
WBC: 7.1 10*3/uL (ref 4.0–10.5)

## 2015-01-11 LAB — HEMOCCULT GUIAC POC 1CARD (OFFICE): Fecal Occult Blood, POC: NEGATIVE

## 2015-01-11 LAB — IRON: IRON: 119 ug/dL (ref 50–180)

## 2015-01-11 LAB — FERRITIN: FERRITIN: 32 ng/mL (ref 22–322)

## 2015-01-11 NOTE — Progress Notes (Signed)
Pre visit review using our clinic review tool, if applicable. No additional management support is needed unless otherwise documented below in the visit note. 

## 2015-01-11 NOTE — Progress Notes (Addendum)
Subjective:    Patient ID: Zachary Fields, male    DOB: 1945-03-29, 69 y.o.   MRN: [id number]  DOS:  01/11/2015 Type of visit - description : Acute Interval history: Few months history of dark stool, black in color, no fresh or red blood to his knowledge. Stools are slightly loose otherwise normal. Was seen recently with abdominal pain, that has decreased. Denies taking Pepto-Bismol, NSAIDs. He does take iron supplements but does not know if the symptoms started before or after he started supplements.   Review of Systems No fever chills No nausea, vomiting. No GERD symptoms No gross hematuria  Past Medical History  Diagnosis Date  . Hypertension   . Anemia   . 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 CRI (chronic renal insufficiency)   . Depression   . 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 Vertell Limber in 1997. Shunt replaced 06-2008  . Dizziness     Chronic paresthesias and dizziness not related with stroke  . Renal cyst     08-2009 Renal cysts( R ) at outside hospital (seen also in a u/s 2007), [facility] for f/u u/s 11-11  . Deaf     L ear, poor hearing R   . 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 Diverticulosis   . Colon polyps   . 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 BPH (benign prostatic hyperplasia) 12/12/2010  . Enlarged prostate with lower urinary tract symptoms (LUTS) 06/2014    Dr. Karsten Ro  . Microscopic hematuria   . 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 Scr returned to baseline of 1.9 on 02/17/2013, Dr. Quincy Simmonds  . Brain cyst   . Vertigo     Past Surgical History  Procedure Laterality Date  . Laparotomy      s/p removal of swallowed of a FB from the stomach when he was 69 y/o    . Brain  surgery  several    s/p a VP shunt, revised  by Dr. Vertell Limber in 1997. Shunt replaced 06-2008.    Social History   Social History  . Marital Status: Divorced    Spouse Name: N/A  . Number of Children: 2  . Years of Education: N/A   Occupational History  . accountant    Social History Main Topics  . Smoking status: Current Every Day Smoker -- 1.50 packs/day    Types: Cigarettes  . Smokeless tobacco: Never Used     Comment: ~ 1.5 ppd, has tried Chantix  . Alcohol Use: 0.0 oz/week    0 Standard drinks or equivalent per week     Comment: Very rarely  . Drug Use: No  . Sexual Activity: Not on file   Other Topics Concern  . Not on file   Social History Narrative   Works in [place], 2 daughters ([name]), lives w/ Estill Bamberg        Medication List       This list is accurate as of: 01/11/15 11:59 PM.  Always use your most recent med list.               albuterol 108 (  90 BASE) MCG/ACT inhaler  Commonly known as:  PROVENTIL HFA;VENTOLIN HFA  2 puffs twice daily for 4-5 days, then q6h PRN cough     aspirin 325 MG tablet  Take 325 mg by mouth daily.     cyanocobalamin 1000 MCG/ML injection  Commonly known as:  (VITAMIN B-12)  Inject 1 mL (1,000 mcg total) into the muscle every 30 (thirty) days. Diagnosis 280.1     dicyclomine 10 MG capsule  Commonly known as:  BENTYL  Take 1 capsule (10 mg total) by mouth 3 (three) times daily as needed for spasms.     felodipine 10 MG 24 hr tablet  Commonly known as:  PLENDIL  Take 1 tablet (10 mg total) by mouth daily.     Fluticasone-Salmeterol 100-50 MCG/DOSE Aepb  Commonly known as:  ADVAIR  Inhale 1 puff into the lungs 2 (two) times daily.     FOLIC ACID PO  Take 1 tablet by mouth daily.     furosemide 40 MG tablet  Commonly known as:  LASIX  Take 1 tablet (40 mg total) by mouth daily.     meclizine 25 MG tablet  Commonly known as:  ANTIVERT  Take 1 tablet (25 mg total) by mouth 3 (three) times daily as needed for  dizziness.     tamsulosin 0.4 MG Caps capsule  Commonly known as:  FLOMAX  Take 1 capsule (0.4 mg total) by mouth daily after supper.           Objective:   Physical Exam BP 122/74 mmHg  Pulse 75  Temp(Src) 98 F (36.7 C) (Oral)  Ht 5\' 8"  (1.727 m)  Wt 165 lb 6 oz (75.014 kg)  BMI 25.15 kg/m2  SpO2 95% General:   Well developed, well nourished . NAD.  HEENT:  Normocephalic . Face symmetric, atraumatic Lungs:  CTA B Normal respiratory effort, no intercostal retractions, no accessory muscle use. Heart: RRR,  no murmur.  no pretibial edema bilaterally  Abdomen:  Not distended, soft, non-tender. No rebound or rigidity.    DRE: Prostate nonenlarged, very firm, not nodular. Stools are definitely black, no diarrhea, no fresh blood. Hemoccult negative. Skin: Not pale. Not jaundice Neurologic:  alert & oriented X3.  Speech normal, gait appropriate for age and unassisted Psych--  Cognition and judgment appear intact.  Cooperative with normal attention span and concentration.  Behavior appropriate. No anxious or depressed appearing.    Assessment & Plan:   Problem list > Hypertension Renal: --Chronic renal insufficiency --Renal cysts: MRI 01-2012 ; last [place] 05-2013 (L 15 mm lesion stable, ? Of new 15 mm L lesion) --Edema --Anemia LUTS, BPH: Sees urology, in 2016 had a cystoscopy, ultrasound and urodynamics, DX with outlet obstruction, prescribed Flomax Depression Hyper homocystinemia ? 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 Plan:  Discontinue iron supplements, check labs We'll get the last renal note regards anemia Had colonoscopy in 2014, was referred to surgery for possible transanal polyp resection. I don't see records from surgery. We'll try to find out. Primary care: DRE showed a very  firm prostate, will check a PSA. RTC 3 months

## 2015-01-11 NOTE — Patient Instructions (Signed)
Get your blood work before you leave   stop the iron supplements  Next visit  for a check In 3-4 months. Please schedule an appointment at the front desk

## 2015-01-12 ENCOUNTER — Other Ambulatory Visit: Payer: Self-pay | Admitting: Internal Medicine

## 2015-01-12 LAB — PSA, MEDICARE: PSA: 0.65 ng/mL (ref <=4.00)

## 2015-01-12 LAB — HEPATITIS C ANTIBODY: HCV Ab: NEGATIVE

## 2015-01-12 NOTE — Assessment & Plan Note (Addendum)
Abdominal pain: Decreased Anemia, black stools: Most recent CBC normal ; po iron supplements is the likely explanation for dark stools. Plan: Discontinue iron supplements, check labs We'll get the last renal note regards anemia Had colonoscopy in 2014, was referred to surgery for possible transanal polyp resection. I don't see records from surgery. We'll try to find out. Primary care: DRE showed a very firm prostate, will check a PSA. RTC 3 months

## 2015-01-25 ENCOUNTER — Ambulatory Visit (INDEPENDENT_AMBULATORY_CARE_PROVIDER_SITE_OTHER): Payer: Medicare HMO

## 2015-01-25 DIAGNOSIS — E538 Deficiency of other specified B group vitamins: Secondary | ICD-10-CM

## 2015-01-25 MED ORDER — CYANOCOBALAMIN 1000 MCG/ML IJ SOLN
1000.0000 ug | Freq: Once | INTRAMUSCULAR | Status: AC
  Administered 2015-01-25: 1000 ug via INTRAMUSCULAR

## 2015-01-25 NOTE — Progress Notes (Signed)
Pre visit review using our clinic review tool, if applicable. No additional management support is needed unless otherwise documented below in the visit note.  Patient in for B12 injection 

## 2015-02-19 ENCOUNTER — Telehealth: Payer: Self-pay

## 2015-02-19 NOTE — Telephone Encounter (Signed)
Advise patient: No need for iron supplements. Also, we do need a ROI signed to get information from surgery.

## 2015-02-19 NOTE — Telephone Encounter (Signed)
MyChart message sent to Pt regarding ROI for [facility], refill request form not authorized and [facility]ed [facility] [address].

## 2015-02-19 NOTE — Telephone Encounter (Signed)
Pt is requesting refill on Ferrous Sulfate 325 mg tablet. Sig: Take 1 tablet by mouth twice daily. Medication no longer on med list as well as his iron and ferritin labs were normal on 01/11/2015. Please advise.

## 2015-02-22 ENCOUNTER — Ambulatory Visit: Payer: Medicare HMO

## 2015-02-26 ENCOUNTER — Ambulatory Visit: Payer: Medicare HMO

## 2015-03-08 ENCOUNTER — Ambulatory Visit (INDEPENDENT_AMBULATORY_CARE_PROVIDER_SITE_OTHER): Payer: Medicare HMO

## 2015-03-08 DIAGNOSIS — E538 Deficiency of other specified B group vitamins: Secondary | ICD-10-CM | POA: Diagnosis not present

## 2015-03-08 MED ORDER — CYANOCOBALAMIN 1000 MCG/ML IJ SOLN
1000.0000 ug | Freq: Once | INTRAMUSCULAR | Status: AC
  Administered 2015-03-08: 1000 ug via INTRAMUSCULAR

## 2015-03-08 NOTE — Progress Notes (Signed)
Pre visit review using our clinic review tool, if applicable. No additional management support is needed unless otherwise documented below in the visit note.  Patient in for B12 injection. Given IM Right deltoid. Patient tolerated well. Will return in 4 weeks.

## 2015-03-26 NOTE — Therapy (Signed)
[facility] [address] [place], [place], [address] Phone: [phone]   Fax:  [phone]  Patient Details  Name: Zachary Fields MRN: [id number] Date of Birth: November 18, 1945 Referring Provider:  No ref. provider found  Encounter Date: 03/26/2015   PHYSICAL THERAPY DISCHARGE SUMMARY  Visits from Start of Care: 1  Current functional level related to goals / functional outcomes:   PT Long Term Goals - 12/07/14 2028    PT LONG TERM GOAL #1   Title Pt will be IND in HEP to improve balance and endurance and decr. dizziness. Target date: 01/04/15.   Status New   PT LONG TERM GOAL #2   Title Pt will improve DGI score to >/20/24 to decr. falls risk. Target date: 01/04/15.   Status New   PT LONG TERM GOAL #3   Title Pt will report no dizziness (0/10) during turns or while driving to perform ADLs in safe manner. Target date: 01/04/15.   Status New   PT LONG TERM GOAL #4   Title Pt will amb. 600' over even/uneven terrain with LRAD at MOD I level to improve functional mobility. Target date: 01/04/15.   Status New   PT LONG TERM GOAL #5   Title Perform SOT and write goal if appropriate. Target date: 01/04/15.   Status New             Remaining deficits: Unknown, as pt never returned after eval.   Education / Equipment: PT frequency/duration  Plan: Patient agrees to discharge.  Patient goals were not met. Patient is being discharged due to not returning since the last visit.  ?????       Zachary Fields L 03/26/2015, 2:10 PM  [facility] [address] [place], [place], [address] Phone: [phone]   Fax:  [phone]   Geoffry Paradise, PT,DPT 03/26/2015 2:10 PM Phone: [phone] Fax: [phone]

## 2015-03-29 ENCOUNTER — Other Ambulatory Visit: Payer: Self-pay | Admitting: Internal Medicine

## 2015-04-03 ENCOUNTER — Telehealth: Payer: Self-pay | Admitting: Internal Medicine

## 2015-04-03 NOTE — Telephone Encounter (Signed)
Pt will need to call insurance and see what his insurance's preferred list is.

## 2015-04-03 NOTE — Telephone Encounter (Signed)
Caller name: Self  Can be reached: [phone]  Pharmacy:   [id number] - [place], [place] [phone] (Phone) [phone] (Fax)         Reason for call: Patient needs new rx to replace Fluticasone-Salmeterol (ADVAIR DISKUS) 100-50 MCG/DOSE AEPB [id number]. States that this one will cost him over $200.

## 2015-04-03 NOTE — Telephone Encounter (Signed)
CAlled patient and informed him of below. Patient will call insurance company and then call back with suggestions

## 2015-04-05 ENCOUNTER — Ambulatory Visit: Payer: Medicare HMO

## 2015-04-05 ENCOUNTER — Ambulatory Visit (INDEPENDENT_AMBULATORY_CARE_PROVIDER_SITE_OTHER): Payer: Medicare HMO | Admitting: *Deleted

## 2015-04-05 DIAGNOSIS — E538 Deficiency of other specified B group vitamins: Secondary | ICD-10-CM | POA: Diagnosis not present

## 2015-04-05 MED ORDER — CYANOCOBALAMIN 1000 MCG/ML IJ SOLN
1000.0000 ug | Freq: Once | INTRAMUSCULAR | Status: AC
  Administered 2015-04-05: 1000 ug via INTRAMUSCULAR

## 2015-04-05 NOTE — Progress Notes (Signed)
Pre visit review using our clinic review tool, if applicable. No additional management support is needed unless otherwise documented below in the visit note.  Pt tolerated injection well.   Next appt: 05/10/2015  Dorrene German, RN

## 2015-04-10 ENCOUNTER — Telehealth: Payer: Self-pay | Admitting: Internal Medicine

## 2015-04-10 MED ORDER — MECLIZINE HCL 25 MG PO TABS: 25.0000 mg | ORAL_TABLET | Freq: Three times a day (TID) | ORAL | Status: DC | PRN

## 2015-04-10 NOTE — Telephone Encounter (Signed)
Pt has previously used Antivert 25 mg 1 tablet tid PRN for dizziness, last fill was 10/09/2014 #20 and 0RF. Okay to refill?

## 2015-04-10 NOTE — Telephone Encounter (Signed)
Rx sent 

## 2015-04-10 NOTE — Telephone Encounter (Signed)
[id number] [place] Fields, [place] - [address]  Pt is having episodes with vertigo again. He is needing a refill sent in.

## 2015-04-10 NOTE — Telephone Encounter (Signed)
Okay to refill, #20;  advise patient that if symptoms are not improving soon he needs to be seen.

## 2015-04-11 ENCOUNTER — Other Ambulatory Visit: Payer: Self-pay | Admitting: Internal Medicine

## 2015-04-16 ENCOUNTER — Other Ambulatory Visit: Payer: Self-pay | Admitting: Internal Medicine

## 2015-04-21 ENCOUNTER — Other Ambulatory Visit: Payer: Self-pay | Admitting: Internal Medicine

## 2015-05-10 ENCOUNTER — Ambulatory Visit: Payer: Medicare HMO

## 2015-05-17 LAB — CBC AND DIFFERENTIAL
HCT: 41 % (ref 41–53)
Hemoglobin: 14.7 g/dL (ref 13.5–17.5)
Neutrophils Absolute: 4 /uL
Platelets: 241 10*3/uL (ref 150–399)
WBC: 6.7 10^3/mL

## 2015-05-17 LAB — HEPATIC FUNCTION PANEL
ALT: 10 U/L (ref 10–40)
AST: 12 U/L — AB (ref 14–40)
Alkaline Phosphatase: 87 U/L (ref 25–125)
BILIRUBIN, TOTAL: 0.4 mg/dL

## 2015-05-17 LAB — BASIC METABOLIC PANEL
BUN: 25 mg/dL — AB (ref 4–21)
Creatinine: 2.1 mg/dL — AB (ref 0.6–1.3)
GLUCOSE: 92 mg/dL
POTASSIUM: 3.9 mmol/L (ref 3.4–5.3)
SODIUM: 138 mmol/L (ref 137–147)

## 2015-05-27 ENCOUNTER — Encounter: Payer: Self-pay | Admitting: Internal Medicine

## 2015-05-28 ENCOUNTER — Encounter: Payer: Self-pay | Admitting: Internal Medicine

## 2015-05-28 ENCOUNTER — Ambulatory Visit (INDEPENDENT_AMBULATORY_CARE_PROVIDER_SITE_OTHER): Payer: Medicare HMO | Admitting: Internal Medicine

## 2015-05-28 ENCOUNTER — Ambulatory Visit (HOSPITAL_BASED_OUTPATIENT_CLINIC_OR_DEPARTMENT_OTHER)
Admission: RE | Admit: 2015-05-28 | Discharge: 2015-05-28 | Disposition: A | Discharge status: Home / Self Care | Payer: Medicare HMO | Source: Ambulatory Visit | Attending: Internal Medicine | Admitting: Internal Medicine

## 2015-05-28 VITALS — BP 120/70 | HR 67 | Temp 98.2°F | Ht 68.0 in | Wt 165.2 lb

## 2015-05-28 DIAGNOSIS — R319 Hematuria, unspecified: Secondary | ICD-10-CM

## 2015-05-28 DIAGNOSIS — I6350 Cerebral infarction due to unspecified occlusion or stenosis of unspecified cerebral artery: Secondary | ICD-10-CM | POA: Diagnosis not present

## 2015-05-28 DIAGNOSIS — G319 Degenerative disease of nervous system, unspecified: Secondary | ICD-10-CM | POA: Diagnosis not present

## 2015-05-28 DIAGNOSIS — R4781 Slurred speech: Secondary | ICD-10-CM | POA: Diagnosis present

## 2015-05-28 DIAGNOSIS — Z09 Encounter for follow-up examination after completed treatment for conditions other than malignant neoplasm: Secondary | ICD-10-CM

## 2015-05-28 DIAGNOSIS — R5381 Other malaise: Secondary | ICD-10-CM | POA: Diagnosis not present

## 2015-05-28 LAB — POCT URINALYSIS DIPSTICK
BILIRUBIN UA: NEGATIVE
GLUCOSE UA: NEGATIVE
KETONES UA: NEGATIVE
LEUKOCYTES UA: NEGATIVE
Nitrite, UA: NEGATIVE
PH UA: 5.5
Protein, UA: 0.15
Spec Grav, UA: >=1.03
Urobilinogen, UA: 4

## 2015-05-28 NOTE — Patient Instructions (Signed)
Go to the first floor and get a CT now  If you have more symptoms such as slurred  speech go to the ER  Come back tomorrow for labs

## 2015-05-28 NOTE — Progress Notes (Signed)
Subjective:    Patient ID: Zachary Fields, male    DOB: 1945-10-04, 70 y.o.   MRN: [id number]  DOS:  05/28/2015   Type of visit - description : Acute visit, here with his daughter and granddaughter. Interval history:  States that is not feeling well, symptoms started yesterday: "I feel sick, weak, feels like my body weights a ton". He has a hard time explaining his symptoms more specifically;  denies fever, chills. Does not feel like he has a cold. No chest pain per se , no difficulty breathing. No lower extremity edema No nausea, vomiting, diarrhea. No change ch in the color of the stools or melena. He has some fleeting upper abdominal pain for few seconds. Mild cough at baseline No headaches. Gait is at baseline. + profound  fatigue  .  The daughter states that yesterday she called him over the phone and his speech was slurred and she is concerned about stroke. She also has trouble getting more information out of him. He continue c/o dizziness which is not a new issue. Also decrease appetite.  Review of Systems  See above.  Past Medical History  Diagnosis Date  . Hypertension   . Anemia   . 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 CRI (chronic renal insufficiency)   . Depression   . 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 Vertell Limber in 1997. Shunt replaced 06-2008  . Dizziness     Chronic paresthesias and dizziness not related with stroke  . Renal cyst     08-2009 Renal cysts( R ) at outside hospital (seen also in a u/s 2007), [facility] for f/u u/s 11-11  . Deaf     L ear, poor hearing R   . 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 Diverticulosis   . Colon polyps   . 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 BPH (benign prostatic hyperplasia) 12/12/2010  . Enlarged prostate with lower urinary tract symptoms (LUTS) 06/2014   Dr. Karsten Ro  . Microscopic hematuria   . 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 Scr returned to baseline of 1.9 on 02/17/2013, Dr. Quincy Simmonds  . Brain cyst   . Vertigo     Past Surgical History  Procedure Laterality Date  . Laparotomy      s/p removal of swallowed of a FB from the stomach when he was 70 y/o    . Brain surgery  several    s/p a VP shunt, revised  by Dr. Vertell Limber in 1997. Shunt replaced 06-2008.    Social History   Social History  . Marital Status: Divorced    Spouse Name: N/A  . Number of Children: 2  . Years of Education: N/A   Occupational History  . accountant    Social History Main Topics  . Smoking status: Current Every Day Smoker -- 1.50 packs/day    Types: Cigarettes  . Smokeless tobacco: Never Used     Comment: ~ 1.5 ppd, has tried Chantix  . Alcohol Use: 0.0 oz/week    0 Standard drinks or equivalent per week     Comment: Very rarely  .  Drug Use: No  . Sexual Activity: Not on file   Other Topics Concern  . Not on file   Social History Narrative   Works in [place], 2 daughters ([name]), lives w/ Estill Bamberg        Medication List       This list is accurate as of: 05/28/15 11:59 PM.  Always use your most recent med list.               albuterol 108 (90 Base) MCG/ACT inhaler  Commonly known as:  PROVENTIL HFA;VENTOLIN HFA  2 puffs twice daily for 4-5 days, then q6h PRN cough     aspirin 325 MG tablet  Take 325 mg by mouth daily.     cyanocobalamin 1000 MCG/ML injection  Commonly known as:  (VITAMIN B-12)  Inject 1 mL (1,000 mcg total) into the muscle every 30 (thirty) days. Diagnosis 280.1     dicyclomine 10 MG capsule  Commonly known as:  BENTYL  Take 1 capsule (10 mg total) by mouth 3 (three) times daily as needed for spasms.     felodipine 10 MG 24 hr tablet  Commonly known as:  PLENDIL  Take 1 tablet (10 mg  total) by mouth daily.     FOLIC ACID PO  Take 1 tablet by mouth daily.     furosemide 40 MG tablet  Commonly known as:  LASIX  Take 1 tablet (40 mg total) by mouth daily.     meclizine 25 MG tablet  Commonly known as:  ANTIVERT  Take 1 tablet (25 mg total) by mouth 3 (three) times daily as needed for dizziness.     tamsulosin 0.4 MG Caps capsule  Commonly known as:  FLOMAX  Take 1 capsule (0.4 mg total) by mouth daily after supper.           Objective:   Physical Exam BP 120/70 mmHg  Pulse 67  Temp(Src) 98.2 F (36.8 C) (Oral)  Ht 5\' 8"  (1.727 m)  Wt 165 lb 3.2 oz (74.934 kg)  BMI 25.12 kg/m2  SpO2 98% General:   Well developed, well nourished . NAD.  HEENT:  Normocephalic . Face symmetric, atraumatic Lungs:  Few rhonchi. Normal respiratory effort, no intercostal retractions, no accessory muscle use. Heart: RRR,  no murmur.  no pretibial edema bilaterally  Abdomen:  Not distended, soft, non-tender. No rebound or rigidity.  Skin: Not pale. Not jaundice Neurologic:  alert & oriented X3.  Speech normal, gait assisted. Motor speech, gait: At baseline EOMI, right ptosis at baseline. Psych--  Cognition and judgment appear intact.  Cooperative with normal attention span and concentration.  Behavior appropriate. No anxious or depressed appearing.    Assessment & Plan:   Assessment Hypertension Renal: --Chronic renal insufficiency --Renal cysts: MRI 01-2012 ; last [place] 05-2013 (L 15 mm lesion stable, ? Of new 15 mm L lesion) --Edema --Anemia LUTS, BPH: Sees urology,   2016: had a cystoscopy, ultrasound and urodynamics, DX with outlet obstruction, prescribed Flomax Depression Hyper homocystinemia ? COPD (no previous PFTs, dx on clinical grounds-- smoker, daily cough). 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 Seems to be at  baseline neurologically today. Had labs at nephrology recently 05/17/2015 (copies on transient to be scan): Creatinine 2.1, potassium 3.9, hemoglobin 14.7. Vitamin D was extremely low, daughter reports he is taking supplement weekly. UAdip (+ blood), check a UA-UCX Plan:  CT head now , ?[facility] Labs tomorrow: b12 , CBC, sedimentation rate. Further advise w/ results

## 2015-05-28 NOTE — Progress Notes (Signed)
Pre visit review using our clinic review tool, if applicable. No additional management support is needed unless otherwise documented below in the visit note. 

## 2015-05-29 ENCOUNTER — Other Ambulatory Visit (INDEPENDENT_AMBULATORY_CARE_PROVIDER_SITE_OTHER): Payer: Medicare HMO

## 2015-05-29 ENCOUNTER — Telehealth: Payer: Self-pay | Admitting: Internal Medicine

## 2015-05-29 DIAGNOSIS — R5381 Other malaise: Secondary | ICD-10-CM

## 2015-05-29 LAB — SEDIMENTATION RATE: Sed Rate: 8 mm/hr (ref 0–22)

## 2015-05-29 LAB — CBC WITH DIFFERENTIAL/PLATELET
BASOS ABS: 0 10*3/uL (ref 0.0–0.1)
BASOS PCT: 0.3 % (ref 0.0–3.0)
EOS ABS: 0.1 10*3/uL (ref 0.0–0.7)
Eosinophils Relative: 1.3 % (ref 0.0–5.0)
HEMATOCRIT: 42.1 % (ref 39.0–52.0)
HEMOGLOBIN: 14.2 g/dL (ref 13.0–17.0)
LYMPHS PCT: 14.3 % (ref 12.0–46.0)
Lymphs Abs: 1.6 10*3/uL (ref 0.7–4.0)
MCHC: 33.8 g/dL (ref 30.0–36.0)
MCV: 90.1 fl (ref 78.0–100.0)
Monocytes Absolute: 0.5 10*3/uL (ref 0.1–1.0)
Monocytes Relative: 4.9 % (ref 3.0–12.0)
Neutro Abs: 8.7 10*3/uL — ABNORMAL HIGH (ref 1.4–7.7)
Neutrophils Relative %: 79.2 % — ABNORMAL HIGH (ref 43.0–77.0)
Platelets: 263 10*3/uL (ref 150.0–400.0)
RBC: 4.67 Mil/uL (ref 4.22–5.81)
RDW: 15.1 % (ref 11.5–15.5)
WBC: 11 10*3/uL — AB (ref 4.0–10.5)

## 2015-05-29 LAB — URINE CULTURE: Colony Count: 50000

## 2015-05-29 LAB — VITAMIN B12: Vitamin B-12: 836 pg/mL (ref 211–911)

## 2015-05-29 NOTE — Telephone Encounter (Signed)
Caller name: Jenny Reichmann Relationship to patient: Daughter Can be reached: [phone]   Reason for call: Called to request that Office notes from yesterday and CT results be faxed to Dr. Vertell Limber. Did not have fax nuumber. Also will be getting labs drawn at [facility] office.

## 2015-05-29 NOTE — Telephone Encounter (Signed)
Informed Daughter Jenny Reichmann) of below.

## 2015-05-29 NOTE — Telephone Encounter (Signed)
B12, ESR, and CBC switched to [facility] lab. OV notes, and CT scan from 05/28/2015 printed and faxed to Dr. Vertell Limber at [phone].

## 2015-05-29 NOTE — Telephone Encounter (Signed)
Received fax confirmation on 05/29/2015 at 0945.

## 2015-05-29 NOTE — Assessment & Plan Note (Addendum)
Malaise : Extremely vague symptoms, episode of slurred speech yesterday. Seems to be at baseline neurologically today. Had labs at nephrology recently 05/17/2015 (copies on transient to be scan): Creatinine 2.1, potassium 3.9, hemoglobin 14.7. Vitamin D was extremely low, daughter reports he is taking supplement weekly. UAdip (+ blood), check a UA-UCX Plan:  CT head now , ?CV[facility] Labs tomorrow: b12 , CBC, sedimentation rate. Further advise w/ results

## 2015-06-26 ENCOUNTER — Other Ambulatory Visit: Payer: Self-pay | Admitting: Internal Medicine

## 2015-07-12 ENCOUNTER — Telehealth: Payer: Self-pay | Admitting: Internal Medicine

## 2015-07-12 DIAGNOSIS — E538 Deficiency of other specified B group vitamins: Secondary | ICD-10-CM

## 2015-07-12 NOTE — Telephone Encounter (Signed)
\  Relation to [id number] Call back number:[phone]   Reason for call:  Patient would like to schedule B12 please advise and would like to know when he should follow up with PCP

## 2015-07-15 ENCOUNTER — Other Ambulatory Visit: Payer: Self-pay | Admitting: Internal Medicine

## 2015-07-15 NOTE — Telephone Encounter (Signed)
Please advise 

## 2015-07-15 NOTE — Telephone Encounter (Signed)
B12 ordered, please see below.

## 2015-07-15 NOTE — Telephone Encounter (Signed)
Schedule a B12 please He is overdue for a CPX, please arrange

## 2015-07-25 NOTE — Telephone Encounter (Signed)
Patient scheduled b12 for 07/30/2015 at [place] with nurse only and 10/07/15 at 8am for physical appointment

## 2015-07-30 ENCOUNTER — Ambulatory Visit (INDEPENDENT_AMBULATORY_CARE_PROVIDER_SITE_OTHER): Payer: Medicare HMO

## 2015-07-30 DIAGNOSIS — E538 Deficiency of other specified B group vitamins: Secondary | ICD-10-CM

## 2015-07-30 MED ORDER — CYANOCOBALAMIN 1000 MCG/ML IJ SOLN
1000.0000 ug | Freq: Once | INTRAMUSCULAR | Status: AC
  Administered 2015-07-30: 1000 ug via INTRAMUSCULAR

## 2015-07-30 NOTE — Progress Notes (Signed)
Pre visit review using our clinic tool,if applicable. No additional management support is needed unless otherwise documented below in the visit note.  

## 2015-08-02 ENCOUNTER — Ambulatory Visit: Payer: Medicare HMO

## 2015-08-06 ENCOUNTER — Telehealth: Payer: Self-pay

## 2015-08-06 NOTE — Telephone Encounter (Signed)
Patient is on the list for [employer] 2017 and may be a good candidate for an AWV in 2017.  Pt does have a CPE scheduled for 09/2015

## 2015-08-30 ENCOUNTER — Ambulatory Visit: Payer: Medicare HMO

## 2015-09-04 ENCOUNTER — Ambulatory Visit (INDEPENDENT_AMBULATORY_CARE_PROVIDER_SITE_OTHER): Payer: Medicare HMO | Admitting: *Deleted

## 2015-09-04 DIAGNOSIS — E538 Deficiency of other specified B group vitamins: Secondary | ICD-10-CM

## 2015-09-04 MED ORDER — CYANOCOBALAMIN 1000 MCG/ML IJ SOLN
1000.0000 ug | Freq: Once | INTRAMUSCULAR | Status: AC
  Administered 2015-09-04: 1000 ug via INTRAMUSCULAR

## 2015-09-04 NOTE — Progress Notes (Signed)
Pre visit review using our clinic review tool, if applicable. No additional management support is needed unless otherwise documented below in the visit note.  Patient tolerated injection well.  Next appt: 10/07/15 CPE w/ Dr. Archie Balboa, RN

## 2015-09-11 ENCOUNTER — Telehealth: Payer: Self-pay | Admitting: *Deleted

## 2015-09-11 NOTE — Telephone Encounter (Signed)
Pt called in c/o bilateral swelling of feet x2 days. The skin on his feet feels taut when he lifts his foot and causes some localized discomfort. No redness, swelling, or pain in legs. No other swelling. He reports his breathing is at baseline. He had an episode of chest pain that felt like gas last night, resolved w/ Gas-X. He reports compliance w/ Lasix 40 mg daily. He is currently on vacation until Monday and has been climbing more stairs than normal. He has tried elevating his legs but has not noticed any change in swelling.   Pt advised to be seen at nearest UC ASAP, ED if symptoms worsen or severe CP/SOB. He agreed to plan, but is unsure where the nearest UC is. He stated he would look online. Appt also scheduled w/ Dr. Larose Kells 09/16/15 w/ the understanding that pt is to see a local provider while on vacation.

## 2015-09-12 NOTE — Telephone Encounter (Signed)
Noted and agree with need for urgent care.

## 2015-09-13 NOTE — Telephone Encounter (Addendum)
Per Care Everywhere, Pt seen at [facility] on 09/11/2015 for foot swelling.

## 2015-09-16 ENCOUNTER — Encounter: Payer: Self-pay | Admitting: Internal Medicine

## 2015-09-16 ENCOUNTER — Ambulatory Visit (INDEPENDENT_AMBULATORY_CARE_PROVIDER_SITE_OTHER): Payer: Medicare HMO | Admitting: Internal Medicine

## 2015-09-16 VITALS — BP 126/60 | HR 81 | Temp 98.3°F | Ht 68.0 in | Wt 163.2 lb

## 2015-09-16 DIAGNOSIS — R600 Localized edema: Secondary | ICD-10-CM | POA: Diagnosis not present

## 2015-09-16 DIAGNOSIS — Z0000 Encounter for general adult medical examination without abnormal findings: Secondary | ICD-10-CM

## 2015-09-16 DIAGNOSIS — R5381 Other malaise: Secondary | ICD-10-CM

## 2015-09-16 NOTE — Assessment & Plan Note (Signed)
Chart reviewed, based on colonoscopy 01-2013 , GI recommended to see surgery , no documentation of that ever happened. Will discuss on RTC

## 2015-09-16 NOTE — Progress Notes (Signed)
Pre visit review using our clinic review tool, if applicable. No additional management support is needed unless otherwise documented below in the visit note. 

## 2015-09-16 NOTE — Progress Notes (Signed)
Subjective:    Patient ID: Zachary Fields, male    DOB: 02/27/1946, 70 y.o.   MRN: [id number]  DOS:  09/16/2015 Type of visit - description : Acute visit Interval history:  Was seen with edema 09/11/2015, see care everywhere. He was at the beach, about 4 hours away from home, admits he probably ate more salt than usual. At a local MD office had a  EKG: reportedly normal, creatinine 1.9, potassium 3.9. Patient was reassured. He is now back to normal.  Review of Systems  Continued to be fatigued, but denies any difficulty breathing. No nausea, vomiting or diarrhea. No headache   Past Medical History  Diagnosis Date  . Hypertension   . Anemia   . 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 CRI (chronic renal insufficiency)   . Depression   . 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 Vertell Limber in 1997. Shunt replaced 06-2008  . Dizziness     Chronic paresthesias and dizziness not related with stroke  . Renal cyst     08-2009 Renal cysts( R ) at outside hospital (seen also in a u/s 2007), [facility] for f/u u/s 11-11  . Deaf     L ear, poor hearing R   . 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 Diverticulosis   . Colon polyps   . 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 BPH (benign prostatic hyperplasia) 12/12/2010  . Enlarged prostate with lower urinary tract symptoms (LUTS) 06/2014    Dr. Karsten Ro  . Microscopic hematuria   . 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 Scr returned to baseline of 1.9 on 02/17/2013, Dr. Quincy Simmonds  . Brain cyst   . Vertigo     Past Surgical History  Procedure Laterality Date  . Laparotomy      s/p removal of swallowed of a FB from the stomach when he was 70 y/o    . Brain surgery  several   s/p a VP shunt, revised  by Dr. Vertell Limber in 1997. Shunt replaced 06-2008.    Social History   Social History  . Marital Status: Divorced    Spouse Name: N/A  . Number of Children: 2  . Years of Education: N/A   Occupational History  . accountant    Social History Main Topics  . Smoking status: Current Every Day Smoker -- 1.50 packs/day    Types: Cigarettes  . Smokeless tobacco: Never Used     Comment: ~ 1.5 ppd, has tried Chantix  . Alcohol Use: 0.0 oz/week    0 Standard drinks or equivalent per week     Comment: Very rarely  . Drug Use: No  . Sexual Activity: Not on file   Other Topics Concern  . Not on file   Social History Narrative   Works in [place], 2 daughters ([name]), lives w/ Estill Bamberg        Medication List       This list is accurate as of: 09/16/15 11:59 PM.  Always use your most recent med list.               albuterol  108 (90 Base) MCG/ACT inhaler  Commonly known as:  PROVENTIL HFA;VENTOLIN HFA  2 puffs twice daily for 4-5 days, then q6h PRN cough     aspirin 325 MG tablet  Take 325 mg by mouth daily.     cyanocobalamin 1000 MCG/ML injection  Commonly known as:  (VITAMIN B-12)  Inject 1 mL (1,000 mcg total) into the muscle every 30 (thirty) days. Diagnosis 280.1     dicyclomine 10 MG capsule  Commonly known as:  BENTYL  Take 1 capsule (10 mg total) by mouth 3 (three) times daily as needed for spasms.     felodipine 10 MG 24 hr tablet  Commonly known as:  PLENDIL  Take 1 tablet (10 mg total) by mouth daily.     FOLIC ACID PO  Take 1 tablet by mouth daily.     furosemide 40 MG tablet  Commonly known as:  LASIX  Take 1 tablet (40 mg total) by mouth daily.     meclizine 25 MG tablet  Commonly known as:  ANTIVERT  Take 1 tablet (25 mg total) by mouth 3 (three) times daily as needed for dizziness.     tamsulosin 0.4 MG Caps capsule  Commonly known as:  FLOMAX  Take 1 capsule (0.4 mg total) by mouth daily after supper.             Objective:   Physical Exam BP 126/60 mmHg  Pulse 81  Temp(Src) 98.3 F (36.8 C) (Oral)  Ht 5\' 8"  (1.727 m)  Wt 163 lb 4 oz (74.05 kg)  BMI 24.83 kg/m2  SpO2 97% General:   Well developed, well nourished . NAD.  HEENT:  Normocephalic . Face symmetric, atraumatic. Neck: No JVD at 45 Lungs:  CTA Normal respiratory effort, no intercostal retractions, no accessory muscle use. Heart: RRR,  no murmur.  Trace and symmetric pretibial edema  Abdomen:  Not distended, soft, non-tender. No rebound or rigidity.  Skin: Not pale. Not jaundice Neurologic:  alert & oriented X3.  Speech normal, gait assisted. Motor speech, gait: At baseline EOMI, right ptosis at baseline. Psych--  Cognition and judgment appear intact.  Cooperative with normal attention span and concentration.  Behavior appropriate. No anxious or depressed appearing.    Assessment & Plan:   Assessment Hypertension Renal: --Chronic renal insufficiency --Renal cysts: MRI 01-2012 ; last [place] 05-2013 (L 15 mm lesion stable, ? Of new 15 mm L lesion) --Edema --Anemia Vitamin D deficiency per labs @ renal 05/17/2015 LUTS, BPH: Sees urology,   2016: had a cystoscopy, ultrasound and urodynamics, DX with outlet obstruction, prescribed Flomax Depression Hyper homocystinemia ? COPD (no previous PFTs, dx on clinical grounds-- smoker, daily cough). 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 Recommend observation, continue with present care. Malaise, slurred speech: See last office visit, CT 05/28/2015 show no acute problems, and large  cyst near the pineal gland?. Was recommended to see neurosurgery, states he did, no reports. RTC in few days for CPX

## 2015-09-17 NOTE — Assessment & Plan Note (Signed)
Lower extremity edema: Resolved at this time, likely related to excessive salt and a car trip (see history of present illness). Recommend observation, continue with present care. Malaise, slurred speech: See last office visit, CT 05/28/2015 show no acute problems, and large cyst near the pineal gland?. Was recommended to see neurosurgery, states he did, no reports. RTC in few days for CPX

## 2015-10-04 ENCOUNTER — Other Ambulatory Visit: Payer: Self-pay | Admitting: Internal Medicine

## 2015-10-07 ENCOUNTER — Encounter: Payer: Self-pay | Admitting: Internal Medicine

## 2015-10-07 ENCOUNTER — Ambulatory Visit (INDEPENDENT_AMBULATORY_CARE_PROVIDER_SITE_OTHER): Payer: Medicare HMO | Admitting: Internal Medicine

## 2015-10-07 VITALS — BP 128/62 | HR 59 | Temp 97.8°F | Resp 14 | Ht 68.0 in | Wt 162.5 lb

## 2015-10-07 DIAGNOSIS — Z23 Encounter for immunization: Secondary | ICD-10-CM

## 2015-10-07 DIAGNOSIS — E538 Deficiency of other specified B group vitamins: Secondary | ICD-10-CM

## 2015-10-07 DIAGNOSIS — I10 Essential (primary) hypertension: Secondary | ICD-10-CM

## 2015-10-07 DIAGNOSIS — Z0000 Encounter for general adult medical examination without abnormal findings: Secondary | ICD-10-CM

## 2015-10-07 MED ORDER — CYANOCOBALAMIN 1000 MCG/ML IJ SOLN
1000.0000 ug | Freq: Once | INTRAMUSCULAR | Status: AC
  Administered 2015-10-07: 1000 ug via INTRAMUSCULAR

## 2015-10-07 NOTE — Progress Notes (Signed)
Pre visit review using our clinic review tool, if applicable. No additional management support is needed unless otherwise documented below in the visit note. 

## 2015-10-07 NOTE — Progress Notes (Signed)
Subjective:    Patient ID: Zachary Fields, male    DOB: 03/27/1945, 70 y.o.   MRN: [id number]  DOS:  10/07/2015 Type of visit - description : CPX Interval history: doing weel, we also discussed other issues  htn - Good med compliance, not ambulatory BPs Tobacco abuse: Still smoking around 1.5 PPD, not ready to quit. BPH: On Flomax, symptoms not completely well controlled.    Review of Systems Constitutional: No fever. No chills. No unexplained wt changes. No unusual sweats  HEENT: No dental problems, no ear discharge, no facial swelling, no voice changes. No eye discharge, no eye  redness , no  intolerance to light   Respiratory: No wheezing , no  difficulty breathing. Sporadic cough, no hemoptysis  Cardiovascular: No CP, no leg swelling , no  Palpitations  GI: no nausea, no vomiting, no diarrhea , no  abdominal pain.  No blood in the stools. No dysphagia, no odynophagia    Endocrine: No polyphagia, no polyuria , no polydipsia  GU: No dysuria, gross hematuria, difficulty urinating. No urinary urgency, no frequency.  Musculoskeletal: No joint swellings or unusual aches or pains  Skin: No change in the color of the skin, palor , no  Rash  Allergic, immunologic: No environmental allergies , no  food allergies  Neurological: Dizziness at baseline, on and off.  no  syncope. No headaches. No diplopia, no slurred, no slurred speech, no motor deficits, no facial  Numbness  Hematological: No enlarged lymph nodes, no easy bruising , no unusual bleedings  Psychiatry: No suicidal ideas, no hallucinations, no beavior problems, no confusion.  Was forced to retire, not very happy about it, denies depression per se  Past Medical History:  Diagnosis Date  . Anemia   . BPH (benign prostatic hyperplasia) 12/12/2010  . Brain cyst   . 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 Vertell Limber in 1997. Shunt replaced 06-2008  . Colon polyps   . CRI (chronic  renal insufficiency)   . Deaf    L ear, poor hearing R   . Depression   . Diverticulosis   . Dizziness    Chronic paresthesias and dizziness not related with stroke  . Enlarged prostate with lower urinary tract symptoms (LUTS) 06/2014   Dr. Karsten Ro  . 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 Hypertension   . Memory impairment    likely secondary to radiation therapy  . 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 Microscopic hematuria   . Peripheral edema    Trace edema, no proteinuria on dipstick, possibly related to CCB, negative serologies, neg SPEP/UPEP. Scr returned to baseline of 1.9 on 02/17/2013, Dr. Quincy Simmonds  . Renal cyst    08-2009 Renal cysts( R ) at outside hospital (seen also in a u/s 2007), [facility] for f/u u/s 11-11  . 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 Vertigo     Past Surgical History:  Procedure Laterality Date  . BRAIN SURGERY  several   s/p a VP shunt, revised  by Dr. Vertell Limber in 1997. Shunt replaced 06-2008.  [facility] Kitchen LAPAROTOMY     s/p removal of swallowed of a FB from the stomach when he was 70 y/o      Social History   Social History  . Marital status: Divorced  Spouse name: N/A  . Number of children: 2  . Years of education: N/A   Occupational History  . RETIRED--accountant [employer]   Social History Main Topics  . Smoking status: Current Every Day Smoker    Packs/day: 1.50    Types: Cigarettes  . Smokeless tobacco: Never Used     Comment: ~ 1.5 ppd, has tried Chantix  . Alcohol use 0.0 oz/week     Comment: Very rarely  . Drug use: No  . Sexual activity: Not on file   Other Topics Concern  . Not on file   Social History Narrative   Works in [place], 2 daughters ([name]), lives w/ [place]     Family History  Problem Relation Age of Onset  . Colon cancer Mother 80    colon  . Cancer Brother     bladder  . Coronary artery disease      Uncle MI at  age 21s  . Prostate cancer Neg Hx   . Diabetes Neg Hx   . Stroke Neg Hx        Medication List       Accurate as of 10/07/15 10:20 AM. Always use your most recent med list.          albuterol 108 (90 Base) MCG/ACT inhaler Commonly known as:  PROVENTIL HFA;VENTOLIN HFA 2 puffs twice daily for 4-5 days, then q6h PRN cough   aspirin 325 MG tablet Take 325 mg by mouth daily.   dicyclomine 10 MG capsule Commonly known as:  BENTYL Take 1 capsule (10 mg total) by mouth 3 (three) times daily as needed for spasms.   felodipine 10 MG 24 hr tablet Commonly known as:  PLENDIL Take 1 tablet (10 mg total) by mouth daily.   FOLIC ACID PO Take 1 tablet by mouth daily.   furosemide 40 MG tablet Commonly known as:  LASIX Take 1 tablet (40 mg total) by mouth daily.   meclizine 25 MG tablet Commonly known as:  ANTIVERT Take 1 tablet (25 mg total) by mouth 3 (three) times daily as needed for dizziness.   tamsulosin 0.4 MG Caps capsule Commonly known as:  FLOMAX Take 1 capsule (0.4 mg total) by mouth daily after supper.          Objective:   Physical Exam BP 128/62 (BP Location: Left Arm, Patient Position: Sitting, Cuff Size: Normal)   Pulse (!) 59   Temp 97.8 F (36.6 C) (Oral)   Resp 14   Ht 5\' 8"  (1.727 m)   Wt 162 lb 8 oz (73.7 kg)   SpO2 98%   BMI 24.71 kg/m    General:   Well developed, well nourished . NAD.  Neck: No  thyromegaly  HEENT:  Normocephalic . Face symmetric, atraumatic Lungs:  CTA B Normal respiratory effort, no intercostal retractions, no accessory muscle use. Heart: RRR,  no murmur.  No pretibial edema bilaterally  Abdomen:  Not distended, soft, non-tender. No rebound or rigidity.   Skin: Exposed areas without rash. Not pale. Not jaundice Neurologic:  alert & oriented X3.  Speech normal, gait appropriate for age and unassisted Strength symmetric and appropriate for age, except for a right facial drop, at baseline. Psych: Cognition and  judgment appear intact.  Cooperative with normal attention span and concentration.  Behavior appropriate. No anxious or depressed appearing.    Assessment & Plan:   Assessment Hypertension Renal: --Chronic renal insufficiency (likely from HTN) --Renal cysts: MRI 01-2012 ; last [place]  05-2013 (L 15 mm lesion stable, ? Of new 15 mm L lesion) --Edema --Anemia Vitamin D deficiency per labs @ renal 05/17/2015 LUTS, BPH:  Sees urology,   2016: had a cystoscopy, ultrasound and urodynamics, DX with outlet obstruction, prescribed Flomax Depression Hyper homocystinemia ? COPD (no previous PFTs, dx on clinical grounds-- smoker, daily cough). 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 Check a BMP Renal insufficiency: Check a BMP LE edema: Controlled. BPH: Still have some sx. Continue Flomax. Dizziness : Actually doing well, not a major issue at this point . RTC 6 months

## 2015-10-07 NOTE — Assessment & Plan Note (Addendum)
Td 2017;  pnm 23: 2011; prevnar 2016; shingles shot 2012  tobacco : still smoking----> states he "will never quit" Colonoscopy 09-2009, again Cscope 01/2013--Dr jacobs--hx + adenomatous polyps colonoscopy 01-2013 , GI recommended to see surgery , no documentation of that ever happened, pt not interested but ok me to communicate w/ his daughter about this--> LMOM , to call if interested in  referral  Prostate cancer screening--last PSA wnl dexa ordered last year- not done , pt not interested   diet and exercise

## 2015-10-07 NOTE — Patient Instructions (Signed)
Get your blood work before you leave   Next visit 6 months   Fall Prevention and [place] cause injuries and can affect all age groups. It is possible to use preventive measures to significantly decrease the likelihood of falls. There are many simple measures which can make your home safer and prevent falls. OUTDOORS  Repair cracks and edges of walkways and driveways.  Remove high doorway thresholds.  Trim shrubbery on the main path into your home.  Have good outside lighting.  Clear walkways of tools, rocks, debris, and clutter.  Check that handrails are not broken and are securely fastened. Both sides of steps should have handrails.  Have leaves, snow, and ice cleared regularly.  Use sand or salt on walkways during winter months.  In the garage, clean up grease or oil spills. BATHROOM  Install night lights.  Install grab bars by the toilet and in the tub and shower.  Use non-skid mats or decals in the tub or shower.  Place a plastic non-slip stool in the shower to sit on, if needed.  Keep floors dry and clean up all water on the floor immediately.  Remove soap buildup in the tub or shower on a regular basis.  Secure bath mats with non-slip, double-sided rug tape.  Remove throw rugs and tripping hazards from the floors. BEDROOMS  Install night lights.  Make sure a bedside light is easy to reach.  Do not use oversized bedding.  Keep a telephone by your bedside.  Have a firm chair with side arms to use for getting dressed.  Remove throw rugs and tripping hazards from the floor. KITCHEN  Keep handles on pots and pans turned toward the center of the stove. Use back burners when possible.  Clean up spills quickly and allow time for drying.  Avoid walking on wet floors.  Avoid hot utensils and knives.  Position shelves so they are not too high or low.  Place commonly used objects within easy reach.  If necessary, use a sturdy step stool with a  grab bar when reaching.  Keep electrical cables out of the way.  Do not use floor polish or wax that makes floors slippery. If you must use wax, use non-skid floor wax.  Remove throw rugs and tripping hazards from the floor. STAIRWAYS  Never leave objects on stairs.  Place handrails on both sides of stairways and use them. Fix any loose handrails. Make sure handrails on both sides of the stairways are as long as the stairs.  Check carpeting to make sure it is firmly attached along stairs. Make repairs to worn or loose carpet promptly.  Avoid placing throw rugs at the top or bottom of stairways, or properly secure the rug with carpet tape to prevent slippage. Get rid of throw rugs, if possible.  Have an electrician put in a light switch at the top and bottom of the stairs. OTHER FALL PREVENTION TIPS  Wear low-heel or rubber-soled shoes that are supportive and fit well. Wear closed toe shoes.  When using a stepladder, make sure it is fully opened and both spreaders are firmly locked. Do not climb a closed stepladder.  Add color or contrast paint or tape to grab bars and handrails in your home. Place contrasting color strips on first and last steps.  Learn and use mobility aids as needed. Install an electrical emergency response system.  Turn on lights to avoid dark areas. Replace light bulbs that burn out immediately. Get light switches  light switches that glow.  Arrange furniture to create clear pathways. Keep furniture in the same place.  Firmly attach carpet with non-skid or double-sided tape.  Eliminate uneven floor surfaces.  Select a carpet pattern that does not visually hide the edge of steps.  Be aware of all pets. OTHER HOME SAFETY TIPS  Set the water temperature for 120 F (48.8 C).  Keep emergency numbers on or near the telephone.  Keep smoke detectors on every level of the home and near sleeping areas. Document Released: 02/13/2002 Document Revised: 08/25/2011  Document Reviewed: 05/15/2011 ExitCare Patient Information 2015 [employer]. This information is not intended to replace advice given to you by your health care provider. Make sure you discuss any questions you have with your health care provider.   Preventive Care for Adults Ages 65 and over  Blood pressure check.** / Every 1 to 2 years.  Lipid and cholesterol check.**/ Every 5 years beginning at age 20.  Lung cancer screening. / Every year if you are aged 55-80 years and have a 30-pack-year history of smoking and currently smoke or have quit within the past 15 years. Yearly screening is stopped once you have quit smoking for at least 15 years or develop a health problem that would prevent you from having lung cancer treatment.  Fecal occult blood test (FOBT) of stool. / Every year beginning at age 50 and continuing until age 75. You may not have to do this test if you get a colonoscopy every 10 years.  Flexible sigmoidoscopy** or colonoscopy.** / Every 5 years for a flexible sigmoidoscopy or every 10 years for a colonoscopy beginning at age 50 and continuing until age 75.  Hepatitis C blood test.** / For all people born from 1945 through 1965 and any individual with known risks for hepatitis C.  Abdominal aortic aneurysm (AAA) screening.** / A one-time screening for ages 65 to 75 years who are current or former smokers.  Skin self-exam. / Monthly.  Influenza vaccine. / Every year.  Tetanus, diphtheria, and acellular pertussis (Tdap/Td) vaccine.** / 1 dose of Td every 10 years.  Varicella vaccine.** / Consult your health care provider.  Zoster vaccine.** / 1 dose for adults aged 60 years or older.  Pneumococcal 13-valent conjugate (PCV13) vaccine.** / Consult your health care provider.  Pneumococcal polysaccharide (PPSV23) vaccine.** / 1 dose for all adults aged 65 years and older.  Meningococcal vaccine.** / Consult your health care provider.  Hepatitis A vaccine.** /  Consult your health care provider.  Hepatitis B vaccine.** / Consult your health care provider.  Haemophilus influenzae type b (Hib) vaccine.** / Consult your health care provider. **Family history and personal history of risk and conditions may change your health care provider's recommendations. Document Released: 04/21/2001 Document Revised: 02/28/2013 Document Reviewed: 07/21/2010 ExitCare Patient Information 2015 [employer]. This information is not intended to replace advice given to you by your health care provider. Make sure you discuss any questions you have with your health care provider.   

## 2015-10-07 NOTE — Assessment & Plan Note (Signed)
HTN: Controlled, continue felodipine, Lasix. Check a BMP Renal insufficiency: Check a BMP LE edema: Controlled. BPH: Still have some sx. Continue Flomax. Dizziness : Actually doing well, not a major issue at this point . RTC 6 months

## 2015-11-07 ENCOUNTER — Ambulatory Visit: Payer: Medicare HMO

## 2015-11-08 ENCOUNTER — Ambulatory Visit: Payer: Medicare HMO

## 2015-11-08 ENCOUNTER — Ambulatory Visit (INDEPENDENT_AMBULATORY_CARE_PROVIDER_SITE_OTHER): Payer: Medicare HMO | Admitting: *Deleted

## 2015-11-08 DIAGNOSIS — Z23 Encounter for immunization: Secondary | ICD-10-CM | POA: Diagnosis not present

## 2015-11-08 DIAGNOSIS — E538 Deficiency of other specified B group vitamins: Secondary | ICD-10-CM | POA: Diagnosis not present

## 2015-11-08 MED ORDER — CYANOCOBALAMIN 1000 MCG/ML IJ SOLN
1000.0000 ug | Freq: Once | INTRAMUSCULAR | Status: AC
  Administered 2015-11-08: 1000 ug via INTRAMUSCULAR

## 2015-11-08 NOTE — Progress Notes (Signed)
Pre visit review using our clinic review tool, if applicable. No additional management support is needed unless otherwise documented below in the visit note.  Patient tolerated B12 injection well. Pt requested to have flu shot today as well; pt tolerated well.  Dorrene German, RN

## 2015-12-01 ENCOUNTER — Other Ambulatory Visit: Payer: Self-pay | Admitting: Internal Medicine

## 2015-12-10 ENCOUNTER — Ambulatory Visit (INDEPENDENT_AMBULATORY_CARE_PROVIDER_SITE_OTHER): Payer: Medicare HMO | Admitting: *Deleted

## 2015-12-10 DIAGNOSIS — E538 Deficiency of other specified B group vitamins: Secondary | ICD-10-CM

## 2015-12-10 MED ORDER — CYANOCOBALAMIN 1000 MCG/ML IJ SOLN
1000.0000 ug | Freq: Once | INTRAMUSCULAR | Status: AC
  Administered 2015-12-10: 1000 ug via INTRAMUSCULAR

## 2015-12-10 NOTE — Progress Notes (Signed)
Pre visit review using our clinic review tool, if applicable. No additional management support is needed unless otherwise documented below in the visit note.  Patient tolerated injection well.  Next appointment: 01/10/16  Dorrene German, RN

## 2015-12-11 LAB — HEPATIC FUNCTION PANEL
ALT: 9 U/L — AB (ref 10–40)
AST: 11 U/L — AB (ref 14–40)
Alkaline Phosphatase: 104 U/L (ref 25–125)
Bilirubin, Total: 0.5 mg/dL

## 2015-12-11 LAB — CBC AND DIFFERENTIAL
HCT: 42 % (ref 41–53)
Hemoglobin: 14.9 g/dL (ref 13.5–17.5)
Neutrophils Absolute: 3 /uL
PLATELETS: 255 10*3/uL (ref 150–399)
WBC: 5.9 10*3/mL

## 2015-12-11 LAB — BASIC METABOLIC PANEL
BUN: 20 mg/dL (ref 4–21)
CREATININE: 2.3 mg/dL — AB (ref 0.6–1.3)
Glucose: 113 mg/dL
Potassium: 4.8 mmol/L (ref 3.4–5.3)
Sodium: 140 mmol/L (ref 137–147)

## 2015-12-24 ENCOUNTER — Encounter: Payer: Self-pay | Admitting: Internal Medicine

## 2015-12-28 ENCOUNTER — Other Ambulatory Visit: Payer: Self-pay | Admitting: Internal Medicine

## 2016-01-10 ENCOUNTER — Ambulatory Visit: Payer: Medicare HMO

## 2016-01-21 ENCOUNTER — Telehealth: Payer: Self-pay | Admitting: *Deleted

## 2016-01-21 NOTE — Telephone Encounter (Signed)
Until he sees his new PCP, get B12 OTC and take 1000 to 2000 mcg daily by mouth.

## 2016-01-21 NOTE — Telephone Encounter (Signed)
[facility] note received via fax  Call:   Date: 01/19/16 Time: 2007   Caller: Self Return number: [phone]  Nurse: Starleen Blue, RN  Chief Complaint: Medication Question (non-symptomatic)  Reason for call: Caller states takes a B12 shot every month and is needing to know the milligrams.  Related visit to physician within the last 2 weeks: N/A  Guideline: N/A  Disposition: FINAL ATTEMPT MADE-no message left  **Called pt and informed him of B12 dose (1000 mcg IM monthly). He would like to know if he can try oral B12 supplement instead of injections. Please advise. He also states he moved to [place] a week ago and will be establishing w/ new PCP ASAP.**

## 2016-01-21 NOTE — Telephone Encounter (Signed)
Pt notified of instructions and verbalized understanding. 

## 2016-01-28 ENCOUNTER — Telehealth: Payer: Self-pay | Admitting: Internal Medicine

## 2016-01-28 MED ORDER — FUROSEMIDE 40 MG PO TABS: 40.0000 mg | ORAL_TABLET | Freq: Every day | ORAL | 1 refills | Status: AC

## 2016-01-28 NOTE — Telephone Encounter (Signed)
Rx sent 

## 2016-01-28 NOTE — Telephone Encounter (Signed)
°  Relationship to patient: Pharmacy  Pharmacy:  [facility], [place] [phone] (Phone) [phone] (Fax     Reason for call: Request refill on furosemide (LASIX) 40 MG tablet [id number]

## 2016-01-29 MED ORDER — MECLIZINE HCL 25 MG PO TABS: 25.0000 mg | ORAL_TABLET | Freq: Three times a day (TID) | ORAL | 0 refills | Status: AC | PRN

## 2016-01-29 MED ORDER — TAMSULOSIN HCL 0.4 MG PO CAPS: 0.4000 mg | ORAL_CAPSULE | Freq: Every day | ORAL | 1 refills | Status: AC

## 2016-01-29 MED ORDER — FELODIPINE ER 10 MG PO TB24: 10.0000 mg | ORAL_TABLET | Freq: Every day | ORAL | 1 refills | Status: AC

## 2016-01-29 NOTE — Telephone Encounter (Signed)
Patient called stating that he will need all his medications sent to the new pharmacy in [place]  tamsulosin (FLOMAX) 0.4 MG CAPS capsule felodipine (PLENDIL) 10 MG 24 hr tablet meclizine (ANTIVERT) 25 MG tablet    Please advise.

## 2016-01-29 NOTE — Addendum Note (Signed)
Addended byDamita Dunnings D on: 01/29/2016 01:32 PM   Modules accepted: Orders

## 2016-01-29 NOTE — Telephone Encounter (Signed)
Called Zachary Fields [facility]) and was informed Pt did move and is in process of finding new PCP. Will send refills; Jenny Reichmann will inform Pt that further refills will need to come from new PCP. Dr. Larose Kells removed as PCP from chart.

## 2016-01-29 NOTE — Telephone Encounter (Signed)
Do you know if Pt moved to [place]?

## 2016-01-29 NOTE — Telephone Encounter (Signed)
LMOM for Estill Bamberg (daughter) inquiring if pt is moving to [place]. Unable to contact the patient. For now send one month supply of Flomax and Plendil to this a pharmacy. Antivert send #20.

## 2016-02-10 ENCOUNTER — Encounter: Payer: Self-pay | Admitting: General Practice

## 2016-02-10 ENCOUNTER — Telehealth: Payer: Self-pay | Admitting: Internal Medicine

## 2016-02-10 NOTE — Telephone Encounter (Signed)
Please advise 

## 2016-02-10 NOTE — Telephone Encounter (Signed)
Notified Santiago Glad and ok to add to nurse schedule for Monday morning - called pt to schedule - VM not setup

## 2016-02-10 NOTE — Telephone Encounter (Signed)
Caller name: Kaylin Kurtis Relationship to patient: self Can be reached: [phone]  Reason for call: Pt will be in town 02/17/16 and wanting a morning appt to get b12 shot. He is visiting his daughter. He is currently living in [place] but has not established with a doctor yet. Please advise if we can administer for pt.

## 2016-02-10 NOTE — Telephone Encounter (Signed)
Duplicate telephone note.  

## 2016-02-10 NOTE — Telephone Encounter (Signed)
That is okay, thx 

## 2016-02-10 NOTE — Telephone Encounter (Signed)
Patient has moved to [place] but will be back in town on the 02/17/16 and would like to know if while he is in town if he could get a B-12 injection. He has not found a provider in [place] yet. Please advise.   Patient phone: [phone]

## 2016-02-14 NOTE — Telephone Encounter (Signed)
Tried calling pt daily and VM not setup, no answer

## 2016-02-17 ENCOUNTER — Ambulatory Visit (INDEPENDENT_AMBULATORY_CARE_PROVIDER_SITE_OTHER): Payer: Medicare HMO | Admitting: Behavioral Health

## 2016-02-17 DIAGNOSIS — E538 Deficiency of other specified B group vitamins: Secondary | ICD-10-CM | POA: Diagnosis not present

## 2016-02-17 MED ORDER — CYANOCOBALAMIN 1000 MCG/ML IJ SOLN
1000.0000 ug | Freq: Once | INTRAMUSCULAR | Status: AC
  Administered 2016-02-17: 1000 ug via INTRAMUSCULAR

## 2016-02-17 NOTE — Progress Notes (Addendum)
Pre visit review using our clinic review tool, if applicable. No additional management support is needed unless otherwise documented below in the visit note.  Patient in clinic today for B12 injection. IM given in Right Deltoid. Patient tolerated injection well. No signs or symptoms of a reaction prior to patient leaving the nurse visit.  Kathlene November, MD

## 2016-04-18 ENCOUNTER — Other Ambulatory Visit: Payer: Self-pay | Admitting: Internal Medicine

## 2016-05-23 ENCOUNTER — Other Ambulatory Visit: Payer: Self-pay | Admitting: Internal Medicine

## 2016-08-24 IMAGING — MR MR HEAD W/O CM
8 of 11 series · 34 of 48 positions shown · IV contrast (Yes   MULTIHANCE)
Comparison: Head CT 10/09/2014 and MRI 01/23/2014

CLINICAL DATA: Vertigo. Generalized body numbness and weakness.
Symptoms began yesterday.

EXAM:
MRI HEAD WITHOUT CONTRAST
TECHNIQUE: Multiplanar, multiecho pulse sequences of the brain and surrounding
structures were obtained without intravenous contrast.

[Series 4: DWI · axial · 3.0mm · 1.09mm/px · z∈[-105,+36]mm · 8 of 94 slices shown (1 of 4)]
[im 1/94]
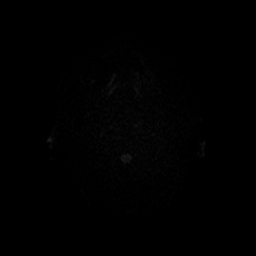
[im 14/94]
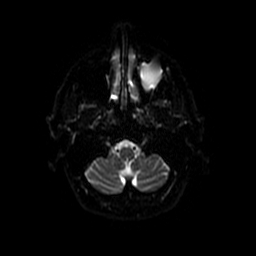
[im 27/94]
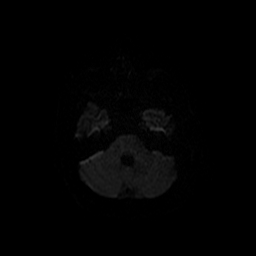
[im 40/94]
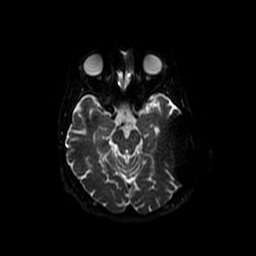
[im 54/94]
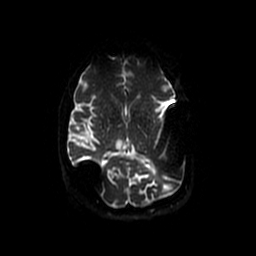
[im 67/94]
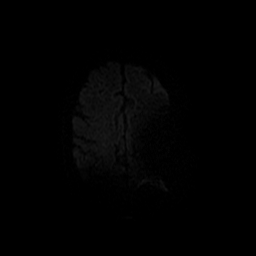
[im 80/94]
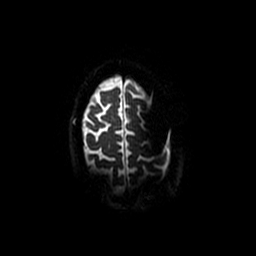
[im 94/94]
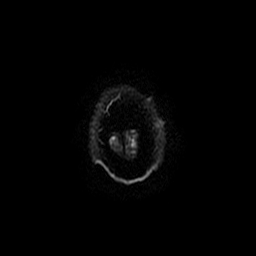

[Series 5: DWI · coronal · 5.0mm · 1.09mm/px · 7 of 68 slices shown (2 of 4)]
[im 1/68]
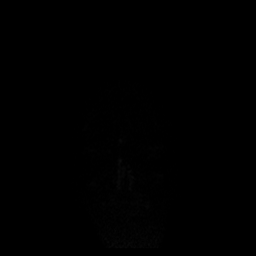
[im 12/68]
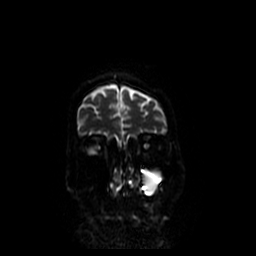
[im 23/68]
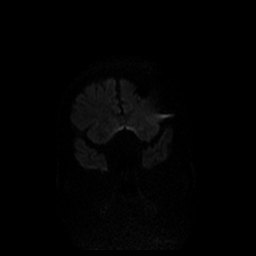
[im 34/68]
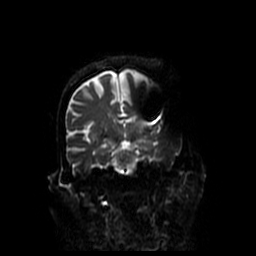
[im 45/68]
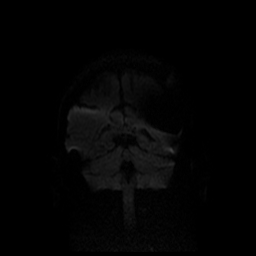
[im 56/68]
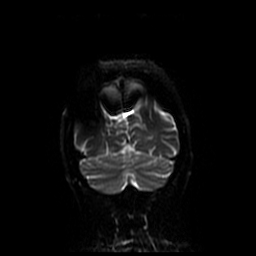
[im 68/68]
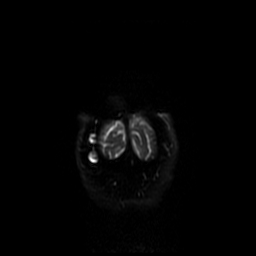

[Series 6: T2 · axial · 5.0mm · 0.43mm/px · z∈[-111,+32]mm · 3 of 26 slices shown (1 of 2)]
[im 1/26]
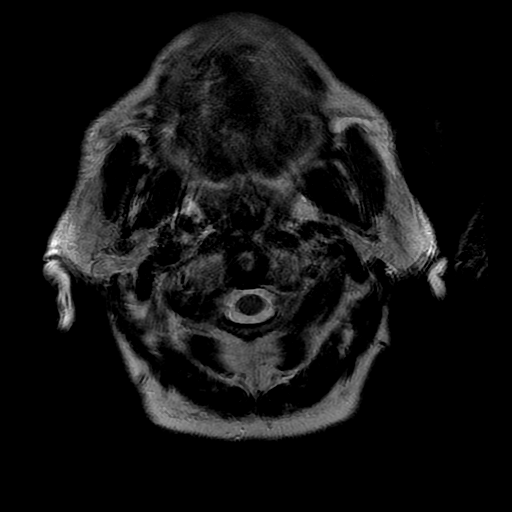
[im 13/26]
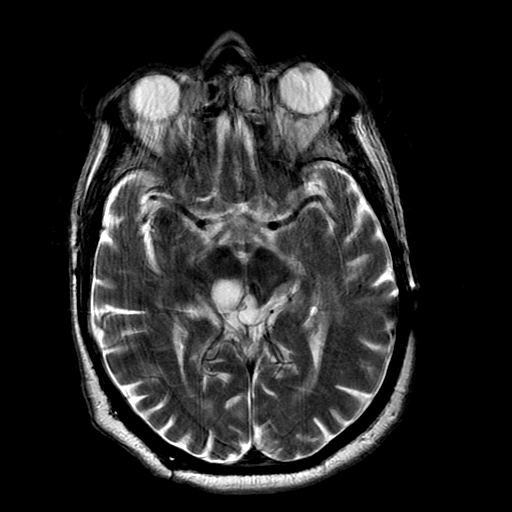
[im 26/26]
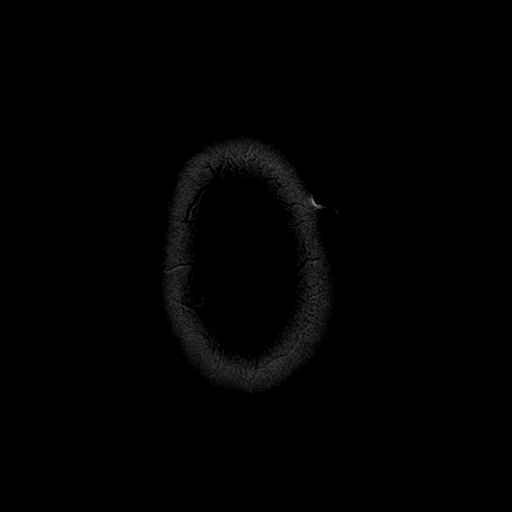

[Series 7: FLAIR · axial · 5.0mm · 0.43mm/px · z∈[-113,+30]mm · 3 of 26 slices shown]
[im 1/26]
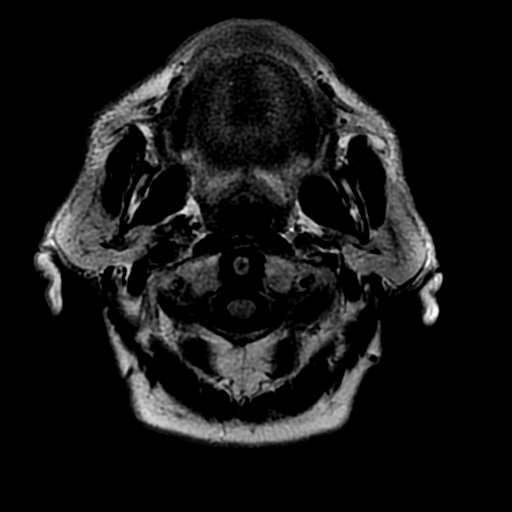
[im 13/26]
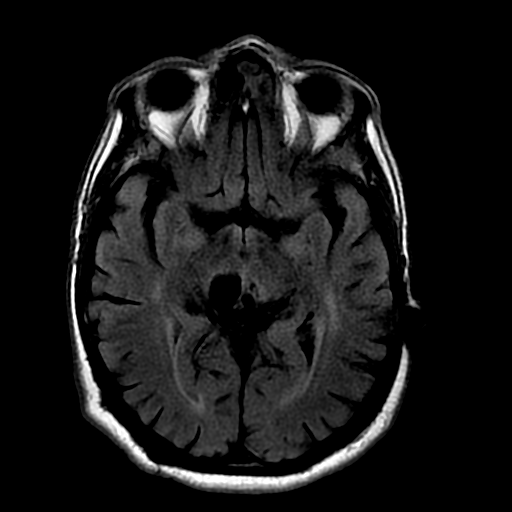
[im 26/26]
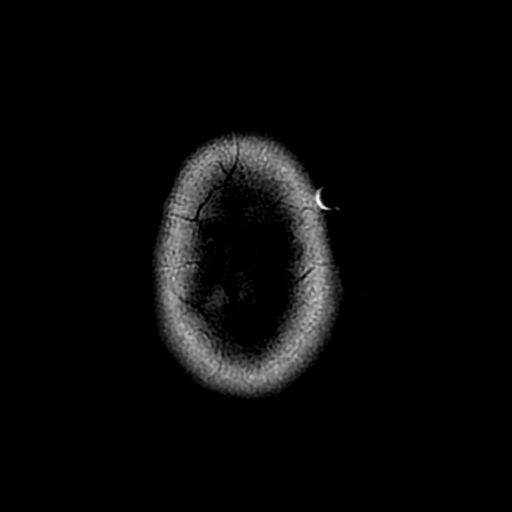

[Series 9: T2 · axial · 5.0mm · 0.43mm/px · z∈[-111,+32]mm · 3 of 26 slices shown (2 of 2)]
[im 1/26]
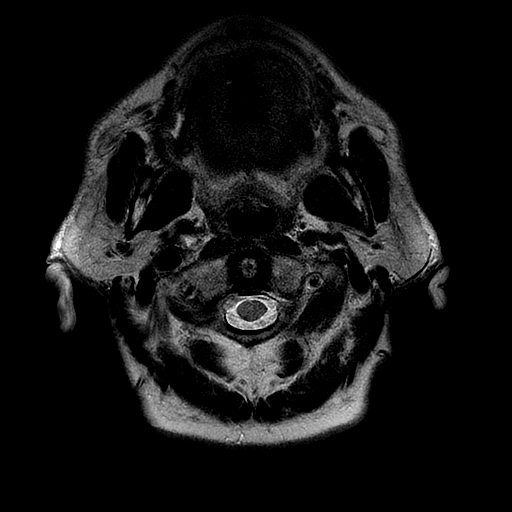
[im 13/26]
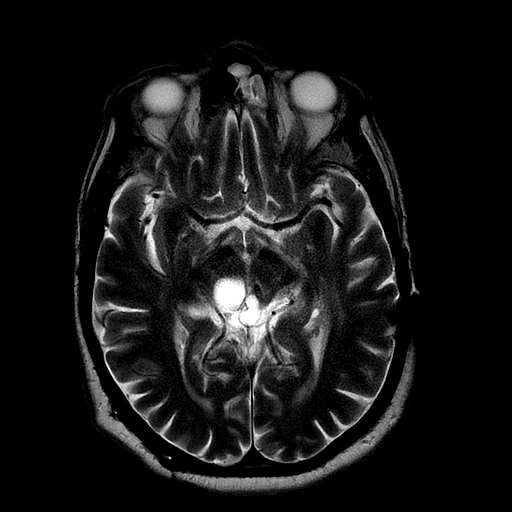
[im 26/26]
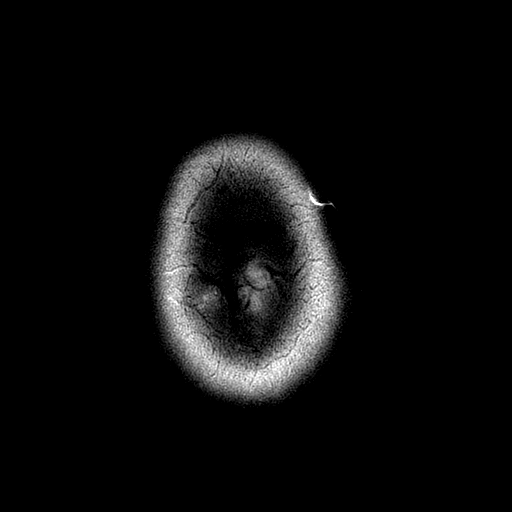

[Series 11: T2 post-contrast · coronal · 5.0mm · 0.39mm/px · 2 of 25 slices shown]
[im 1/25]
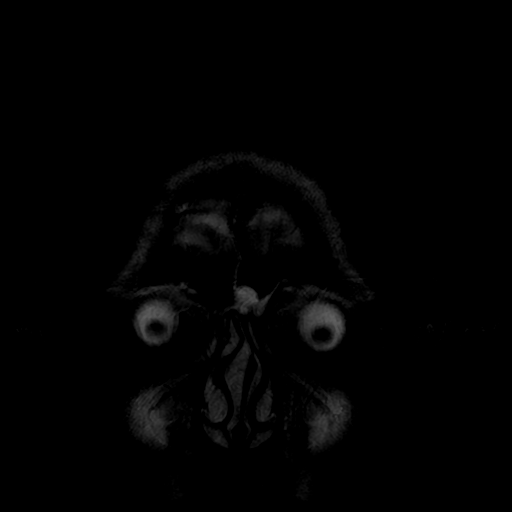
[im 25/25]
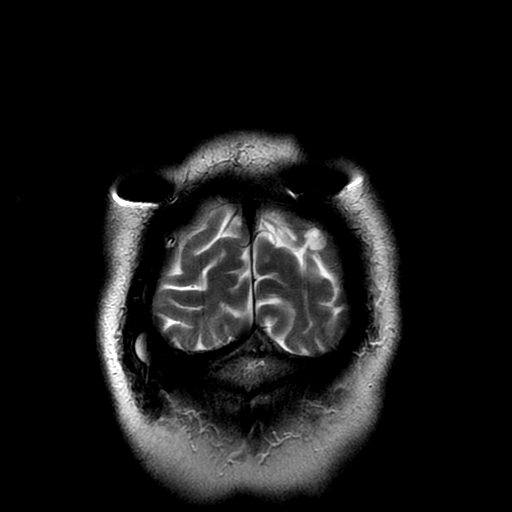

[Series 400: DWI · axial · 3.0mm · 1.09mm/px · z∈[-105,+36]mm · 5 of 49 slices shown (3 of 4)]
[im 1/49]
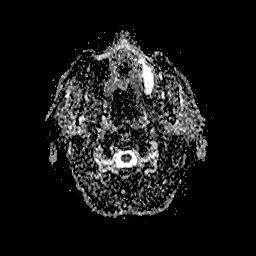
[im 13/49]
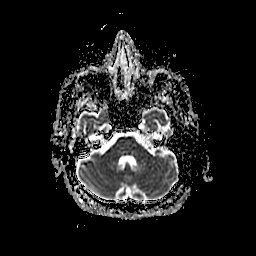
[im 25/49]
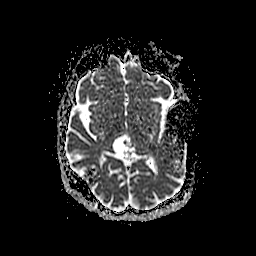
[im 37/49]
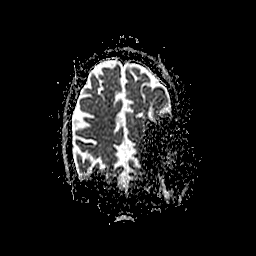
[im 49/49]
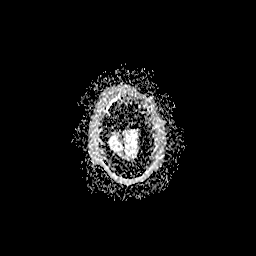

[Series 500: DWI · coronal · 5.0mm · 1.09mm/px · 3 of 34 slices shown (4 of 4)]
[im 1/34]
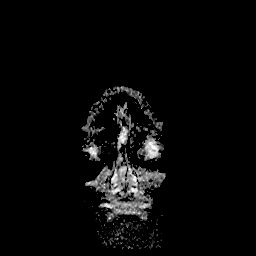
[im 17/34]
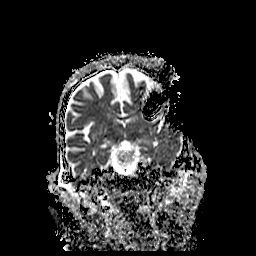
[im 34/34]
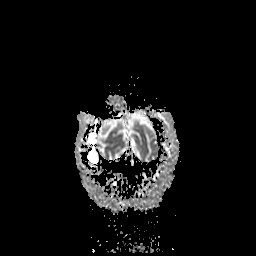

[34 of 48 positions shown; findings below may reference images not displayed]

FINDINGS: Susceptibility artifact is again seen related to 4 shunt catheters
which are grossly unchanged in position compared to the prior MRI.
Allowing for associated artifact, no acute infarct is identified.
The ventricles rema[REDACTED]ompressed, unchanged from the prior MRI.

Foci of chronic microhemorrhage are again seen in both temporal
lobes as well as in the upper left pons at the site of a remote
infarct. No midline shift or extra-axial fluid collection is seen.
T2 hyperintensity surrounding the shunt catheters, most notably in
the left parietal lobe, is unchanged. Small foci of T2
hyperintensity scattered elsewhere in the cerebral white matter
bilaterally are similar to the prior MRI and nonspecific but
compatible with mild chronic small vessel ischemic disease.

A cystic and solid mass is again seen in the pineal region centered
right of midline. The right paramedian cystic component has
increased in size from the most recent prior MRI, measuring 2.1 x
1.4 x 1.7 cm (previously 1.1 x 1.3 x 1.2 cm), although it remains
smaller than on older MRI from 08/12/2011. Evaluation of the solid
component is partially limited by lack of IV contrast, without gross
interval change from the prior study. There is increased mass effect
on the right thalamus due to the enlarged cystic component.

Orbits are unremarkable. Large left maxillary sinus mucous retention
cyst and mild-to-moderate left frontal and left ethmoid sinus
mucosal thickening are noted. Mastoid air cells are clear. Major
intracranial vascular flow voids are preserved.
IMPRESSION: 1. No acute infarct allowing for susceptibility artifact due to the
shunt catheters.
2. Enlargement of the cystic portion of the pineal region mass
compared to the prior MRI.

## 2018-01-05 ENCOUNTER — Encounter: Payer: Self-pay | Admitting: Gastroenterology

## 2018-01-27 ENCOUNTER — Encounter: Payer: Self-pay | Admitting: Gastroenterology

## 2022-03-09 DEATH — deceased
